# Patient Record
Sex: Female | Born: 1965 | Race: White | Hispanic: No | Smoking: Never smoker
Health system: Southern US, Community
[De-identification: ages and names within clinical notes are randomized; demographics above are authoritative.]

## PROBLEM LIST (undated history)

## (undated) DIAGNOSIS — Z8619 Personal history of other infectious and parasitic diseases: Secondary | ICD-10-CM

## (undated) DIAGNOSIS — G8929 Other chronic pain: Secondary | ICD-10-CM

## (undated) DIAGNOSIS — A770 Spotted fever due to Rickettsia rickettsii: Secondary | ICD-10-CM

## (undated) DIAGNOSIS — M549 Dorsalgia, unspecified: Secondary | ICD-10-CM

## (undated) DIAGNOSIS — F329 Major depressive disorder, single episode, unspecified: Secondary | ICD-10-CM

## (undated) DIAGNOSIS — M797 Fibromyalgia: Secondary | ICD-10-CM

## (undated) DIAGNOSIS — F419 Anxiety disorder, unspecified: Secondary | ICD-10-CM

## (undated) DIAGNOSIS — R413 Other amnesia: Secondary | ICD-10-CM

## (undated) DIAGNOSIS — M792 Neuralgia and neuritis, unspecified: Secondary | ICD-10-CM

## (undated) DIAGNOSIS — F32A Depression, unspecified: Secondary | ICD-10-CM

## (undated) DIAGNOSIS — I10 Essential (primary) hypertension: Secondary | ICD-10-CM

## (undated) DIAGNOSIS — R410 Disorientation, unspecified: Secondary | ICD-10-CM

## (undated) HISTORY — PX: ANKLE SURGERY: SHX546

## (undated) HISTORY — PX: TONSILLECTOMY: SUR1361

## (undated) HISTORY — PX: ABDOMINAL HYSTERECTOMY: SHX81

## (undated) HISTORY — PX: OTHER SURGICAL HISTORY: SHX169

## (undated) HISTORY — PX: TUBAL LIGATION: SHX77

---

## 1998-05-29 ENCOUNTER — Emergency Department (HOSPITAL_COMMUNITY): Admission: EM | Admit: 1998-05-29 | Discharge: 1998-05-29 | Payer: Self-pay | Admitting: *Deleted

## 1998-12-30 ENCOUNTER — Emergency Department (HOSPITAL_COMMUNITY): Admission: EM | Admit: 1998-12-30 | Discharge: 1998-12-30 | Payer: Self-pay | Admitting: Emergency Medicine

## 1999-08-01 ENCOUNTER — Encounter (INDEPENDENT_AMBULATORY_CARE_PROVIDER_SITE_OTHER): Payer: Self-pay | Admitting: Specialist

## 1999-08-01 ENCOUNTER — Inpatient Hospital Stay (HOSPITAL_COMMUNITY): Admission: RE | Admit: 1999-08-01 | Discharge: 1999-08-03 | Payer: Self-pay | Admitting: Obstetrics and Gynecology

## 2000-01-01 ENCOUNTER — Encounter: Payer: Self-pay | Admitting: Orthopedic Surgery

## 2000-01-01 ENCOUNTER — Encounter: Admission: RE | Admit: 2000-01-01 | Discharge: 2000-01-01 | Payer: Self-pay | Admitting: Orthopedic Surgery

## 2000-01-03 ENCOUNTER — Encounter: Admission: RE | Admit: 2000-01-03 | Discharge: 2000-01-03 | Payer: Self-pay | Admitting: Orthopedic Surgery

## 2000-01-03 ENCOUNTER — Encounter: Payer: Self-pay | Admitting: Orthopedic Surgery

## 2000-01-27 ENCOUNTER — Inpatient Hospital Stay (HOSPITAL_COMMUNITY): Admission: EM | Admit: 2000-01-27 | Discharge: 2000-01-28 | Payer: Self-pay | Admitting: Emergency Medicine

## 2000-01-27 ENCOUNTER — Encounter: Payer: Self-pay | Admitting: Orthopaedic Surgery

## 2000-01-27 ENCOUNTER — Encounter: Payer: Self-pay | Admitting: Emergency Medicine

## 2000-12-12 ENCOUNTER — Encounter: Admission: RE | Admit: 2000-12-12 | Discharge: 2000-12-12 | Payer: Self-pay | Admitting: Orthopedic Surgery

## 2000-12-12 ENCOUNTER — Encounter: Payer: Self-pay | Admitting: Orthopedic Surgery

## 2000-12-23 ENCOUNTER — Other Ambulatory Visit: Admission: RE | Admit: 2000-12-23 | Discharge: 2000-12-23 | Payer: Self-pay | Admitting: Obstetrics and Gynecology

## 2001-01-01 ENCOUNTER — Emergency Department (HOSPITAL_COMMUNITY): Admission: EM | Admit: 2001-01-01 | Discharge: 2001-01-01 | Payer: Self-pay | Admitting: Emergency Medicine

## 2001-01-01 ENCOUNTER — Encounter: Payer: Self-pay | Admitting: Emergency Medicine

## 2002-02-08 ENCOUNTER — Inpatient Hospital Stay (HOSPITAL_COMMUNITY): Admission: AD | Admit: 2002-02-08 | Discharge: 2002-02-14 | Payer: Self-pay | Admitting: Psychiatry

## 2003-08-08 ENCOUNTER — Ambulatory Visit (HOSPITAL_BASED_OUTPATIENT_CLINIC_OR_DEPARTMENT_OTHER): Admission: RE | Admit: 2003-08-08 | Discharge: 2003-08-08 | Payer: Self-pay | Admitting: Pain Medicine

## 2003-08-12 ENCOUNTER — Other Ambulatory Visit: Admission: RE | Admit: 2003-08-12 | Discharge: 2003-08-12 | Payer: Self-pay | Admitting: Obstetrics and Gynecology

## 2004-06-06 ENCOUNTER — Ambulatory Visit: Payer: Self-pay | Admitting: Pain Medicine

## 2004-06-12 ENCOUNTER — Ambulatory Visit: Payer: Self-pay | Admitting: Pain Medicine

## 2004-06-20 ENCOUNTER — Ambulatory Visit: Payer: Self-pay | Admitting: Pain Medicine

## 2004-07-09 ENCOUNTER — Inpatient Hospital Stay: Payer: Self-pay | Admitting: Pain Medicine

## 2004-07-09 ENCOUNTER — Ambulatory Visit: Payer: Self-pay | Admitting: Pain Medicine

## 2004-08-23 ENCOUNTER — Ambulatory Visit: Payer: Self-pay | Admitting: Physician Assistant

## 2004-09-11 ENCOUNTER — Ambulatory Visit: Payer: Self-pay | Admitting: Pain Medicine

## 2004-11-15 ENCOUNTER — Ambulatory Visit: Payer: Self-pay | Admitting: Gastroenterology

## 2004-11-16 ENCOUNTER — Ambulatory Visit: Payer: Self-pay | Admitting: Physician Assistant

## 2004-11-20 ENCOUNTER — Ambulatory Visit: Payer: Self-pay | Admitting: Pain Medicine

## 2004-11-27 ENCOUNTER — Ambulatory Visit: Payer: Self-pay | Admitting: Gastroenterology

## 2004-11-28 ENCOUNTER — Ambulatory Visit: Payer: Self-pay | Admitting: Gastroenterology

## 2004-12-11 ENCOUNTER — Ambulatory Visit: Payer: Self-pay | Admitting: Pain Medicine

## 2005-01-02 ENCOUNTER — Ambulatory Visit: Payer: Self-pay | Admitting: Physician Assistant

## 2005-02-05 ENCOUNTER — Ambulatory Visit: Payer: Self-pay | Admitting: Physician Assistant

## 2005-02-12 ENCOUNTER — Ambulatory Visit: Payer: Self-pay | Admitting: Pain Medicine

## 2005-03-11 ENCOUNTER — Ambulatory Visit: Payer: Self-pay | Admitting: Pain Medicine

## 2005-04-09 ENCOUNTER — Ambulatory Visit: Payer: Self-pay | Admitting: Physician Assistant

## 2005-05-13 ENCOUNTER — Ambulatory Visit: Payer: Self-pay | Admitting: Physician Assistant

## 2005-06-06 ENCOUNTER — Ambulatory Visit: Payer: Self-pay | Admitting: Pain Medicine

## 2005-06-18 ENCOUNTER — Ambulatory Visit: Payer: Self-pay | Admitting: Physician Assistant

## 2005-06-25 ENCOUNTER — Ambulatory Visit: Payer: Self-pay | Admitting: Pain Medicine

## 2005-07-17 ENCOUNTER — Ambulatory Visit: Payer: Self-pay | Admitting: Physician Assistant

## 2005-07-30 ENCOUNTER — Ambulatory Visit: Payer: Self-pay | Admitting: Pain Medicine

## 2005-08-06 ENCOUNTER — Ambulatory Visit: Payer: Self-pay | Admitting: Pulmonary Disease

## 2005-08-15 ENCOUNTER — Ambulatory Visit: Payer: Self-pay | Admitting: Physician Assistant

## 2005-09-10 ENCOUNTER — Ambulatory Visit: Payer: Self-pay | Admitting: Pulmonary Disease

## 2005-09-11 ENCOUNTER — Ambulatory Visit: Payer: Self-pay | Admitting: Physician Assistant

## 2005-10-02 ENCOUNTER — Ambulatory Visit: Payer: Self-pay | Admitting: Gastroenterology

## 2005-10-04 ENCOUNTER — Ambulatory Visit: Payer: Self-pay | Admitting: Gastroenterology

## 2005-10-15 ENCOUNTER — Ambulatory Visit: Payer: Self-pay | Admitting: Physician Assistant

## 2005-10-22 ENCOUNTER — Ambulatory Visit: Payer: Self-pay | Admitting: Physician Assistant

## 2005-10-24 ENCOUNTER — Ambulatory Visit: Payer: Self-pay | Admitting: Pain Medicine

## 2005-10-29 ENCOUNTER — Ambulatory Visit: Payer: Self-pay | Admitting: Gastroenterology

## 2005-11-08 ENCOUNTER — Ambulatory Visit: Payer: Self-pay | Admitting: Physician Assistant

## 2005-12-11 ENCOUNTER — Ambulatory Visit: Payer: Self-pay | Admitting: Physician Assistant

## 2005-12-12 ENCOUNTER — Ambulatory Visit: Payer: Self-pay | Admitting: Pain Medicine

## 2005-12-16 ENCOUNTER — Ambulatory Visit: Payer: Self-pay | Admitting: Pain Medicine

## 2006-01-09 ENCOUNTER — Ambulatory Visit: Payer: Self-pay | Admitting: Physician Assistant

## 2006-02-07 ENCOUNTER — Other Ambulatory Visit: Admission: RE | Admit: 2006-02-07 | Discharge: 2006-02-07 | Payer: Self-pay | Admitting: Obstetrics and Gynecology

## 2006-02-11 ENCOUNTER — Ambulatory Visit: Payer: Self-pay | Admitting: Physician Assistant

## 2006-03-11 ENCOUNTER — Ambulatory Visit: Payer: Self-pay | Admitting: Physician Assistant

## 2006-03-31 ENCOUNTER — Ambulatory Visit: Payer: Self-pay | Admitting: Physician Assistant

## 2006-04-03 ENCOUNTER — Ambulatory Visit: Payer: Self-pay | Admitting: Pain Medicine

## 2006-04-16 ENCOUNTER — Ambulatory Visit: Payer: Self-pay | Admitting: Physician Assistant

## 2006-05-15 ENCOUNTER — Ambulatory Visit: Payer: Self-pay | Admitting: Physician Assistant

## 2006-05-19 ENCOUNTER — Ambulatory Visit: Payer: Self-pay | Admitting: Pain Medicine

## 2006-05-26 ENCOUNTER — Emergency Department (HOSPITAL_COMMUNITY): Admission: EM | Admit: 2006-05-26 | Discharge: 2006-05-26 | Payer: Self-pay | Admitting: Emergency Medicine

## 2006-05-30 ENCOUNTER — Ambulatory Visit (HOSPITAL_COMMUNITY): Admission: RE | Admit: 2006-05-30 | Discharge: 2006-05-30 | Payer: Self-pay | Admitting: Neurosurgery

## 2006-06-03 ENCOUNTER — Ambulatory Visit: Payer: Self-pay | Admitting: Pain Medicine

## 2006-06-12 ENCOUNTER — Ambulatory Visit: Payer: Self-pay | Admitting: Physician Assistant

## 2006-06-19 ENCOUNTER — Ambulatory Visit: Payer: Self-pay | Admitting: Pain Medicine

## 2006-07-10 ENCOUNTER — Ambulatory Visit: Payer: Self-pay | Admitting: Physician Assistant

## 2006-08-07 ENCOUNTER — Ambulatory Visit: Payer: Self-pay | Admitting: Physician Assistant

## 2006-08-14 ENCOUNTER — Ambulatory Visit: Payer: Self-pay | Admitting: Pain Medicine

## 2006-09-10 ENCOUNTER — Ambulatory Visit: Payer: Self-pay | Admitting: Physician Assistant

## 2006-10-14 ENCOUNTER — Ambulatory Visit: Payer: Self-pay | Admitting: Pain Medicine

## 2006-11-12 ENCOUNTER — Ambulatory Visit: Payer: Self-pay | Admitting: Physician Assistant

## 2006-11-28 ENCOUNTER — Ambulatory Visit: Payer: Self-pay | Admitting: Physician Assistant

## 2006-12-08 ENCOUNTER — Ambulatory Visit: Payer: Self-pay | Admitting: Physician Assistant

## 2006-12-22 ENCOUNTER — Ambulatory Visit: Payer: Self-pay | Admitting: Physician Assistant

## 2007-01-21 ENCOUNTER — Ambulatory Visit: Payer: Self-pay | Admitting: Physician Assistant

## 2007-01-29 ENCOUNTER — Ambulatory Visit: Payer: Self-pay | Admitting: Pain Medicine

## 2007-02-17 ENCOUNTER — Ambulatory Visit: Payer: Self-pay | Admitting: Physician Assistant

## 2007-03-17 ENCOUNTER — Ambulatory Visit: Payer: Self-pay | Admitting: Pain Medicine

## 2007-06-22 ENCOUNTER — Ambulatory Visit: Payer: Self-pay | Admitting: Physician Assistant

## 2007-07-21 ENCOUNTER — Ambulatory Visit: Payer: Self-pay | Admitting: Physician Assistant

## 2007-08-11 ENCOUNTER — Ambulatory Visit: Payer: Self-pay | Admitting: Pain Medicine

## 2007-08-14 ENCOUNTER — Ambulatory Visit: Payer: Self-pay | Admitting: Physician Assistant

## 2007-12-31 ENCOUNTER — Ambulatory Visit: Payer: Self-pay | Admitting: Physician Assistant

## 2008-01-21 ENCOUNTER — Ambulatory Visit: Payer: Self-pay | Admitting: Pain Medicine

## 2008-09-21 ENCOUNTER — Ambulatory Visit: Payer: Self-pay | Admitting: Pain Medicine

## 2008-09-22 ENCOUNTER — Ambulatory Visit: Payer: Self-pay | Admitting: Pain Medicine

## 2009-10-08 ENCOUNTER — Emergency Department (HOSPITAL_COMMUNITY): Admission: EM | Admit: 2009-10-08 | Discharge: 2009-10-08 | Payer: Self-pay | Admitting: Emergency Medicine

## 2010-02-18 ENCOUNTER — Emergency Department (HOSPITAL_COMMUNITY): Admission: EM | Admit: 2010-02-18 | Discharge: 2010-02-18 | Payer: Self-pay | Admitting: Emergency Medicine

## 2010-05-27 ENCOUNTER — Emergency Department (HOSPITAL_COMMUNITY): Admission: EM | Admit: 2010-05-27 | Discharge: 2010-05-27 | Payer: Self-pay | Admitting: Emergency Medicine

## 2010-09-15 ENCOUNTER — Encounter: Payer: Self-pay | Admitting: Neurosurgery

## 2010-09-16 ENCOUNTER — Encounter: Payer: Self-pay | Admitting: Obstetrics and Gynecology

## 2010-12-28 ENCOUNTER — Emergency Department (HOSPITAL_COMMUNITY)
Admission: EM | Admit: 2010-12-28 | Discharge: 2010-12-28 | Disposition: A | Discharge status: Home / Self Care | Payer: Medicare Other | Attending: Emergency Medicine | Admitting: Emergency Medicine

## 2010-12-28 ENCOUNTER — Emergency Department (HOSPITAL_COMMUNITY): Payer: Medicare Other

## 2010-12-28 DIAGNOSIS — R05 Cough: Secondary | ICD-10-CM | POA: Insufficient documentation

## 2010-12-28 DIAGNOSIS — R0602 Shortness of breath: Secondary | ICD-10-CM | POA: Insufficient documentation

## 2010-12-28 DIAGNOSIS — J40 Bronchitis, not specified as acute or chronic: Secondary | ICD-10-CM | POA: Insufficient documentation

## 2010-12-28 DIAGNOSIS — R509 Fever, unspecified: Secondary | ICD-10-CM | POA: Insufficient documentation

## 2010-12-28 DIAGNOSIS — R059 Cough, unspecified: Secondary | ICD-10-CM | POA: Insufficient documentation

## 2010-12-28 DIAGNOSIS — R5381 Other malaise: Secondary | ICD-10-CM | POA: Insufficient documentation

## 2010-12-28 DIAGNOSIS — IMO0001 Reserved for inherently not codable concepts without codable children: Secondary | ICD-10-CM | POA: Insufficient documentation

## 2010-12-28 DIAGNOSIS — W03XXXA Other fall on same level due to collision with another person, initial encounter: Secondary | ICD-10-CM | POA: Insufficient documentation

## 2010-12-28 DIAGNOSIS — R51 Headache: Secondary | ICD-10-CM | POA: Insufficient documentation

## 2010-12-28 DIAGNOSIS — IMO0002 Reserved for concepts with insufficient information to code with codable children: Secondary | ICD-10-CM | POA: Insufficient documentation

## 2010-12-28 DIAGNOSIS — R5383 Other fatigue: Secondary | ICD-10-CM | POA: Insufficient documentation

## 2010-12-31 ENCOUNTER — Emergency Department (HOSPITAL_COMMUNITY): Admission: EM | Admit: 2010-12-31 | Payer: Medicare Other | Source: Home / Self Care

## 2011-01-06 ENCOUNTER — Emergency Department (HOSPITAL_COMMUNITY): Payer: Medicare Other

## 2011-01-06 ENCOUNTER — Emergency Department (HOSPITAL_COMMUNITY)
Admission: EM | Admit: 2011-01-06 | Discharge: 2011-01-06 | Disposition: A | Discharge status: Home / Self Care | Payer: Medicare Other | Attending: Emergency Medicine | Admitting: Emergency Medicine

## 2011-01-06 DIAGNOSIS — J069 Acute upper respiratory infection, unspecified: Secondary | ICD-10-CM | POA: Insufficient documentation

## 2011-01-06 DIAGNOSIS — R509 Fever, unspecified: Secondary | ICD-10-CM | POA: Insufficient documentation

## 2011-01-06 DIAGNOSIS — R5381 Other malaise: Secondary | ICD-10-CM | POA: Insufficient documentation

## 2011-01-06 DIAGNOSIS — R059 Cough, unspecified: Secondary | ICD-10-CM | POA: Insufficient documentation

## 2011-01-06 DIAGNOSIS — R05 Cough: Secondary | ICD-10-CM | POA: Insufficient documentation

## 2011-01-06 DIAGNOSIS — IMO0001 Reserved for inherently not codable concepts without codable children: Secondary | ICD-10-CM | POA: Insufficient documentation

## 2011-01-06 DIAGNOSIS — F341 Dysthymic disorder: Secondary | ICD-10-CM | POA: Insufficient documentation

## 2011-01-06 DIAGNOSIS — J3489 Other specified disorders of nose and nasal sinuses: Secondary | ICD-10-CM | POA: Insufficient documentation

## 2011-01-06 LAB — BASIC METABOLIC PANEL
CO2: 20 mEq/L (ref 19–32)
Chloride: 98 mEq/L (ref 96–112)
GFR calc Af Amer: >60 mL/min (ref >60)
Potassium: 4.2 mEq/L (ref 3.5–5.1)
Sodium: 132 mEq/L — ABNORMAL LOW (ref 135–145)

## 2011-01-06 LAB — CBC
HCT: 36.5 % (ref 36.0–46.0)
Hemoglobin: 12.7 g/dL (ref 12.0–15.0)
MCHC: 34.8 g/dL (ref 30.0–36.0)
RBC: 3.93 MIL/uL (ref 3.87–5.11)
WBC: 9.1 10*3/uL (ref 4.0–10.5)

## 2011-01-06 LAB — DIFFERENTIAL
Basophils Absolute: 0 10*3/uL (ref 0.0–0.1)
Lymphocytes Relative: 28 % (ref 12–46)
Monocytes Absolute: 0.6 10*3/uL (ref 0.1–1.0)
Neutro Abs: 6 10*3/uL (ref 1.7–7.7)
Neutrophils Relative %: 66 % (ref 43–77)

## 2011-01-06 LAB — URINALYSIS, ROUTINE W REFLEX MICROSCOPIC
Glucose, UA: NEGATIVE mg/dL
Hgb urine dipstick: NEGATIVE
Specific Gravity, Urine: 1.034 — ABNORMAL HIGH (ref 1.005–1.030)
pH: 5 (ref 5.0–8.0)

## 2011-01-11 NOTE — H&P (Signed)
[facility]  Patient:    Kimberly [name], Kimberly [name] Visit Number: [id number] MRN: [id number]          Service Type: PSY Location: 500 0501 01 Attending Physician:  Jeanice Lim Dictated by:   Candi Leash. Orsini, N.P. Admit Date:  02/08/2002                     Psychiatric Admission Assessment  IDENTIFYING INFORMATION:  This is a 45 year old single white female voluntarily admitted for depression and suicidal thoughts on February 08, 2002.  HISTORY OF PRESENT ILLNESS:  The patient presents with a history of depression, suicidal thoughts with no specific plan.  The patient is feeling very overwhelmed and stressed over her comp case from work.  She feels very hopeless and helpless.  She has been on OxyContin and hydrocodone for fibromyalgia for the past year.  Reports no increase in use and does not desire to be detoxed.  The patient did state that she did try to detox herself one year ago but her intention now is not to get off her medication.  She reports decreased sleep, decreased appetite, denies any psychotic symptoms. The patient states she is too miserable to attend groups.  She has signed her 72-hour release.  PAST PSYCHIATRIC HISTORY:  First hospitalization to [facility]. Was at [facility] in the year of 2000 for a suicide attempt, where patient overdosed.  She has a history of trying to detox herself from her pain pills on her own in the spring of 2000.  SOCIAL HISTORY:  This is a 45 year old single white female.  She has two children, 15 and 20.  She lives with her 47 year old.  She is disabled from her medical problem, fibromyalgia.  She has a DWI pending in July.  FAMILY HISTORY:  Mother with depression.  Cousin with bipolar.  ALCOHOL/DRUG HISTORY:  Nonsmoker.  Denies any alcohol or substance abuse.  PRIMARY CARE [place]:  Dr. Tiburcio Pea in [place] and she sees a rheumatologist, Dr. Janeece Agee, in [place].  MEDICAL PROBLEMS:   Fibromyalgia.  MEDICATIONS:  OxyContin, hydrocodone prescribed by Dr. Tiburcio Pea.  She has been off her medications since Monday morning.  Has recently been put on Paxil CR 12.5 mg.  DRUG ALLERGIES:  ZOLOFT (unsure of the reaction).  PHYSICAL EXAMINATION:  GENERAL APPEARANCE:  The patient is a 45 year old Caucasian female in no acute distress.  Well-developed, appears her stated age.  Somewhat unkempt, alert and cooperative.  VITAL SIGNS:  She is 6 feet 4 inches.  She is 145 pounds.  Temperature 98.9, heart rate 104, respirations 20, blood pressure 133/100.  HEENT:  Head is normocephalic.  Hair is clean, brown, almost shoulder-length, evenly distributed.  External ear canals are patent.  Hearing is appropriate to conversation.  No sinus tenderness.  No nasal discharge.  NECK:  Supple.  No JVD.  Negative lymphadenopathy.  Thyroid is nonpalpable, nontender.  CHEST:  Clear to auscultation.  HEART:  Regular rate and rhythm without murmurs, gallops or rubs.  BREASTS:  Exam is deferred.  MUSCULOSKELETAL:  No joint swelling or deformity.  Good range of motion. Muscle strength and tone is equal bilaterally.  SKIN:  Warm and dry with good turgor.  Good strong bilateral radial pulses. Nail beds are pink with good capillary refill.  No rashes or lacerations.  NEURO:  Cranial nerves are grossly intact.  Good grip strength bilaterally. No involuntary movements.  Cerebellar function intact with heel to shin and normal alternating movements.  LABORATORY DATA:  CBC within normal limits.  CMET within normal limits. Urinalysis was negative.  MENTAL STATUS EXAMINATION:  She is an alert, young adult, casually dressed female.  Cooperative.  Fair eye contact.  Speech is clear.  Affect is depressed.  Thought processes are coherent.  There is no evidence of psychosis.  No auditory or visual hallucinations.  No suicidal or homicidal ideation.  Cognitive function intact.  Memory is fair.  Judgment  is fair. Insight is fair.  DIAGNOSES: Axis I:    Major depression, recurrent. Axis II:   Deferred. Axis III:  Fibromyalgia. Axis IV:   Problems with occupation, legal system, other psychosocial            problems, medical problems. Axis V:    Current 30; estimated this past year 61.  PLAN:  Voluntary admission for depression and opiate dependence.  Will contract for safety.  Check every 15 minutes.  Will initiate the Wytensin protocol.  The patient was agreeable to be detoxed.  Paxil was continued for her depression.  The patient to attend groups, to follow up with mental health, to be medication-compliant.  TENTATIVE LENGTH OF STAY:  Three to five days. Dictated by:   Candi Leash. Orsini, N.P. Attending Physician:  Jeanice Lim DD:  02/09/02 TD:  02/09/02 Job: [id number]

## 2011-01-11 NOTE — Op Note (Signed)
[place]  Patient:    Kimberly [name], Kimberly [name] [facility]                 MRN: [id number] Proc. Date: 01/27/00 Adm. Date:  [id number] Disc. Date: [id number] Attending:  Randolm Idol Dictator:   Claude Manges. Cleophas Dunker, M.D.                           Operative Report  PREOPERATIVE DIAGNOSIS:  Displaced bimalleolar fracture, left ankle.  POSTOPERATIVE DIAGNOSIS:  Displaced bimalleolar fracture, left ankle.  PROCEDURE:  Open reduction internal fixation.  SURGEON:  Dr. Cleophas Dunker.  ASSISTANT:  Arnoldo Morale, P.A.  ANESTHESIA:  General orotracheal.  COMPLICATIONS:  None.  DESCRIPTION OF PROCEDURE:  With the patient comfortable on the operating room table and under general orotracheal anesthesia, a thigh tourniquet was applied to the left thigh.  The left leg was then elevated and prepped with Duraprep from the tips of the toes to above the knee.  Sterile draping was performed. With the extremity still elevated it was Esmarched, exsanguinated with a proximal tourniquet at 350 mmHg.  Initial procedure was performed laterally.  A longitudinal incision was made beginning at the tip of the lateral malleolus extending proximally.  Sharp dissection, about a 3 to 4 inch incision was made, and then by blunt dissection the soft tissue was elevated off the fibula periosteum. Subperiosteal dissection was performed.  The fracture was identified just proximal to the ankle mortis.  It was slightly oblique, and the distal fragment was displaced laterally.  Under direct visualization the fracture was reduced and maintained with a bone clamp.  A 7 hole small fragment ace titanium plate was affixed to the fibula and bent to conform to its shape.  Each of the holes was subsequently drilled, measured, and filled with self-taping screws.  The distal three holes with filled with cancellous screws, and the proximal four holes with the cortical screws.  Image santification  revealed very distal screw was not in bone.  Accordingly, it was re-drilled, and redirected obliquely and more proximally.  At that point it had excellent fixation, the fracture was reduced anatomically, and the screws were well within the bone, did not cross the ankle mortis.  The wound was irrigated with saline solution.  An oblique incision was then made over the medial malleolus, and via blunt dissection carried down to the subcutaneous tissue.  Soft tissue was elevated off the fracture which was displaced probably 8 to 9 mm.  Under direct visualization the fracture was reduced.  There was periosteal invagination of the fracture site preventing anatomic reduction.  This was elevated off the bone and then reduced and maintained with the bone clamp and a K-wire.  A drill hole was then made and filled with a 42 mm cancellous screw with a washer.  It had excellent fixation.  Image sensification of both AP and lateral projections revealed excellent position of the ankle mortis and fracture fragments.  The wounds were irrigated with saline solution.  The periosteum closed with 3-0 Vicryl, subcutaneous with 3-0 Vicryl, skin closed with skin clips. Marcaine 0.25% without epinephrine was injected in the wound edges.  A sterile bulky dressing was applied, followed by posterior splints.  The tourniquet was deflated with immediate capillary refill to the toes.  The patient tolerated the procedure well without complications. DD:  01/27/00 TD:  01/30/00 Job: [id number]

## 2011-01-11 NOTE — Discharge Summary (Signed)
Kimberly [name], Kimberly [name]                      ACCOUNT NO.:  [id number]   MEDICAL RECORD NO.:  [id number]                   PATIENT TYPE:  PS   LOCATION:  0501                                 FACILITY:  [facility]   PHYSICIAN:  Reymundo Poll. Dub Mikes, M.D.                DATE OF BIRTH:  1966/06/26   DATE OF ADMISSION:  02/08/2002  DATE OF DISCHARGE:  02/14/2002                                 DISCHARGE SUMMARY   CHIEF COMPLAINT AND PRESENT ILLNESS:  This was the first admission to [facility] for this 45 year old single white female admitted for  depression and suicidal thoughts.  History of feeling very overwhelmed,  stressed over her workman's comp case from work.  Feels very hopeless,  helpless.  Has been on OxyContin and hydrocodone for fibromyalgia for the  past year.  No increasing use and does not desire to be detoxed.  Did state  that she did try to detox herself one year ago but her intention is not to  get off her medication.  Decreased sleep, decreased appetite, no psychotic  symptoms.   PAST PSYCHIATRIC HISTORY:  First time at [facility].  She was  in [facility] in 2000.   ALCOHOL/DRUG HISTORY:  Denies the use or abuse of any substances.   PAST MEDICAL HISTORY:  Fibromyalgia.   MEDICATIONS:  OxyContin, hydrocodone.  Off medications since Monday.  Recently been put on Paxil CR 12.5 mg.   PHYSICAL EXAMINATION:  Performed and failed to show any acute findings.   MENTAL STATUS EXAM:  Alert, young female casually dressed, cooperative.  Fair eye contact.  Speech is clear.  Affect is depressed.  Thought processes  are coherent.  No evidence of psychosis.  No auditory or visual  hallucinations.  No suicidal or homicidal ideation.  Cognition well-  preserved.   ADMISSION DIAGNOSES:   AXIS I:  Major depression, recurrent.   AXIS II:  No diagnosis.   AXIS III:  Fibromyalgia.   AXIS IV:  Moderate.   AXIS V:  Global Assessment of  Functioning on admission 30; highest Global  Assessment of Functioning in the last year 70.   LABORATORY DATA:  CBC was within normal limits.  Blood chemistries were  within normal limits.  Thyroid profile was within normal limits.  Drug  screen was positive for benzodiazepines and opiates that she had been  prescribed.   HOSPITAL COURSE:  She was admitted and started intensive individual and  group psychotherapy.  Worked on helping her with her mood.  She was able to,  in individual and group psychotherapy, work on dealing with the losses.  She  did recognized that she was dependent on the benzodiazepines and that she  was wanting to give it a try to come off.  She also minimized the use of  alcohol.  She was recently charged with a DWI.  She was detoxified using  clonidine and she dealt with issues of grief, loss, admitting that she just  wanted to numb herself.  Issues of the death of the mother.  She was given  trazodone for sleep that was beneficial.  As the hospitalization progressed,  her mood improved.  Her affect became brighter.  She was more cognizant and  aimed with the plan to clean up, so they can reassess her treatment for her  pain.  Upon discharge, fully detoxed, in full contact with reality.  Mood  improved.  Affect brighter.  No suicidal or homicidal ideation.   DISCHARGE DIAGNOSES:   AXIS I:  1. Major depression, recurrent.  2. Alcohol abuse.   AXIS II:  No diagnosis.   AXIS III:  Fibromyalgia.   AXIS IV:  Moderate.   AXIS V:  Global Assessment of Functioning upon discharge 60.   DISCHARGE MEDICATIONS:  1. Paxil CR 12.5 mg.  2. Ambien 10 mg daily.   FOLLOW UP:  Dr. Earline Mayotte.                                                Madie Reno A. Dub Mikes, M.D.    IAL/MEDQ  D:  03/31/2002  T:  04/03/2002  Job:  [id number]

## 2011-01-11 NOTE — H&P (Signed)
[place]  Patient:    Kimberly [name], Kimberly [name] [facility]                 MRN: [id number] Adm. Date:  [id number] Attending:  Randolm Idol                         History and Physical  CHIEF COMPLAINT:  Left ankle pain.  HISTORY:  This 45 year old female twisted her left ankle, "coming off the last step of my deck" this early evening.  She experienced immediate onset of pain and swelling; however, she was able to bear weight "with pain."  She was brought to the emergency room by her friend, with films noting a displaced bimalleolar fracture.  She will now require open reduction and internal fixation.  ALLERGIES:  ZOLOFT, causing chest pain.  PRESENT MEDICATIONS: 1. Elavil and she did not know the dose. 2. Prozac 20 mg a day. 3. Hydrocodone p.r.n. for pain. 4. Neurontin 600 mg three times a day. 5. Diazepam.  PAST MEDICAL HISTORY:  She has a history of an on-the-job injury to her right knee in 1997 with subsequent surgery in 1998 at [facility] and has developed reflex sympathetic dystrophy.  She is thus being treated through the pain clinic at [facility] with the above medicine.  She has also had a history of previous left knee surgery performed by Dr. Lenard Galloway. 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  She is having trouble with both of her knees but is much more symptomatic on the right than she is on the left.  There is a history of fibromyalgia.  PREVIOUS SURGERY:  Include that as noted above.  She has had a previous history of tubal ligation and hysterectomy in December of 2000.  PHYSICAL EXAMINATION:  VITAL SIGNS:  Temperature is 97.2.  Pulse 88.  Respiratory rate was 18.  Blood pressure was 132/75.  GENERAL:  She is a well-developed, well-nourished 45 year old white female without much pain.  She was awake, alert and oriented x 3.  She had one beer probably four to five hours prior to admission.  NECK:   She had full range of motion of the cervical spine without pain, although she notes that on occasion, she will have pain as a result of her fibromyalgia.  CHEST:  Clear.  BREASTS:  Deferred.  CARDIAC:  There were no murmurs, rubs, or gallops in the precordium.  ABDOMEN:  Soft and nontender with active bowel sounds.  EXTREMITIES:  Her left ankle revealed moderate edema with early ecchymosis, both medially and laterally.  Skin was intact.  Neurovascular exam appeared to be intact.  She had gross crepitation with inversion and eversion of her ankle and there was pain directly over the lateral and medial malleoli.  I did examine both of her knees; there was no effusion.  She had mild medial and lateral joint pain, more so on the right knee than on the left.  I could not elicit instability and even under anesthesia in the operating room, I felt that she had about a 2-mm anterior drawer sign, both on the left as well as the right.  IMPRESSION:  Displaced bimalleolar fracture, left ankle.  PLAN:  Open reduction and internal fixation. DD:  01/27/00 TD:  01/28/00 Job: [id number]

## 2011-03-18 ENCOUNTER — Emergency Department (HOSPITAL_COMMUNITY): Payer: Medicare Other

## 2011-03-18 ENCOUNTER — Encounter (HOSPITAL_COMMUNITY): Payer: Self-pay | Admitting: Radiology

## 2011-03-18 ENCOUNTER — Emergency Department (HOSPITAL_COMMUNITY)
Admission: EM | Admit: 2011-03-18 | Discharge: 2011-03-18 | Disposition: A | Discharge status: Home / Self Care | Payer: Medicare Other | Attending: Emergency Medicine | Admitting: Emergency Medicine

## 2011-03-18 DIAGNOSIS — H538 Other visual disturbances: Secondary | ICD-10-CM | POA: Insufficient documentation

## 2011-03-18 DIAGNOSIS — R220 Localized swelling, mass and lump, head: Secondary | ICD-10-CM | POA: Insufficient documentation

## 2011-03-18 DIAGNOSIS — R51 Headache: Secondary | ICD-10-CM | POA: Insufficient documentation

## 2011-03-18 HISTORY — DX: Other chronic pain: G89.29

## 2011-03-18 HISTORY — DX: Depression, unspecified: F32.A

## 2011-03-18 HISTORY — DX: Anxiety disorder, unspecified: F41.9

## 2011-03-18 HISTORY — DX: Fibromyalgia: M79.7

## 2011-03-18 HISTORY — DX: Dorsalgia, unspecified: M54.9

## 2011-03-18 HISTORY — DX: Major depressive disorder, single episode, unspecified: F32.9

## 2011-07-05 ENCOUNTER — Emergency Department (HOSPITAL_COMMUNITY): Payer: Medicare Other

## 2011-07-05 ENCOUNTER — Encounter (HOSPITAL_COMMUNITY): Payer: Self-pay | Admitting: Emergency Medicine

## 2011-07-05 ENCOUNTER — Emergency Department (HOSPITAL_COMMUNITY)
Admission: EM | Admit: 2011-07-05 | Discharge: 2011-07-05 | Discharge status: Against Medical Advice | Payer: Medicare Other | Attending: Emergency Medicine | Admitting: Emergency Medicine

## 2011-07-05 DIAGNOSIS — F410 Panic disorder [episodic paroxysmal anxiety] without agoraphobia: Secondary | ICD-10-CM | POA: Insufficient documentation

## 2011-07-05 DIAGNOSIS — M549 Dorsalgia, unspecified: Secondary | ICD-10-CM | POA: Insufficient documentation

## 2011-07-05 DIAGNOSIS — F419 Anxiety disorder, unspecified: Secondary | ICD-10-CM

## 2011-07-05 DIAGNOSIS — Z79899 Other long term (current) drug therapy: Secondary | ICD-10-CM | POA: Insufficient documentation

## 2011-07-05 DIAGNOSIS — F341 Dysthymic disorder: Secondary | ICD-10-CM | POA: Insufficient documentation

## 2011-07-05 DIAGNOSIS — G8929 Other chronic pain: Secondary | ICD-10-CM | POA: Insufficient documentation

## 2011-07-05 DIAGNOSIS — R079 Chest pain, unspecified: Secondary | ICD-10-CM | POA: Insufficient documentation

## 2011-07-05 MED ORDER — LORAZEPAM 1 MG PO TABS: 1.0000 mg | ORAL_TABLET | Freq: Once | ORAL | Status: DC

## 2011-07-05 NOTE — ED Provider Notes (Signed)
History     CSN: [id number] Arrival date & time: 07/05/2011 12:43 AM   First MD Initiated Contact with Patient 07/05/11 0115      Chief Complaint  Patient presents with  . Panic Attack    (Consider location/radiation/quality/duration/timing/severity/associated sxs/prior treatment) Patient is a 45 y.o. female presenting with anxiety.  Anxiety This is a recurrent problem. The current episode started 3 to 5 hours ago. The problem occurs constantly. The problem has not changed since onset.Associated symptoms include chest pain. Pertinent negatives include no abdominal pain, no headaches and no shortness of breath. The symptoms are aggravated by nothing. The symptoms are relieved by nothing. Treatments tried: 2 mg of klonopin and a beer. The treatment provided no relief.  Denies f/c/r.  No DOE, no SOB, no n/v/d.  Has pressure across the chest.    Past Medical History  Diagnosis Date  . Anxiety   . Depression   . Fibromyalgia   . Back pain, chronic     History reviewed. No pertinent past surgical history.  History reviewed. No pertinent family history.  History  Substance Use Topics  . Smoking status: Not on file  . Smokeless tobacco: Not on file  . Alcohol Use: Not on file    OB History    Grav Para Term Preterm Abortions TAB SAB Ect Mult Living                  Review of Systems  Constitutional: Negative for activity change.  HENT: Negative.   Respiratory: Negative for shortness of breath.   Cardiovascular: Positive for chest pain.  Gastrointestinal: Negative for abdominal pain and abdominal distention.  Genitourinary: Negative for difficulty urinating.  Musculoskeletal: Negative.   Neurological: Negative for headaches.  Hematological: Negative.   Psychiatric/Behavioral: Negative for suicidal ideas, hallucinations, confusion, sleep disturbance and decreased concentration.    Allergies  Gabapentin and Zoloft  Home Medications   Current Outpatient Rx  Name  Route Sig Dispense Refill  . DULOXETINE HCL 60 MG PO CPEP Oral Take 60 mg by mouth 2 (two) times daily.      Marland Kitchen HYDROCODONE-ACETAMINOPHEN 10-325 MG PO TABS Oral Take 1 tablet by mouth every 4 (four) hours as needed. For pain     . SYNTHROID PO Oral Take 1 tablet by mouth daily.      Marland Kitchen PRESCRIPTION MEDICATION Oral Take 1 tablet by mouth daily. Pt takes a blood pressure medication, but is unsure of name or strength     . ZOLPIDEM TARTRATE 10 MG PO TABS Oral Take 10 mg by mouth at bedtime.        BP 151/105  Pulse 68  Temp(Src) 97.9 F (36.6 C) (Oral)  Resp 17  SpO2 100%  Physical Exam  Constitutional: She is oriented to person, place, and time. She appears well-developed and well-nourished. No distress.  HENT:  Head: Normocephalic and atraumatic.  Eyes: EOM are normal. Right eye exhibits no discharge. Left eye exhibits no discharge.  Neck: Neck supple.  Cardiovascular: Normal rate and regular rhythm.  Exam reveals no gallop and no friction rub.   No murmur heard. Pulmonary/Chest: Effort normal and breath sounds normal. No respiratory distress.  Abdominal: Soft. Bowel sounds are normal.  Musculoskeletal: Normal range of motion.  Neurological: She is alert and oriented to person, place, and time.  Skin: Skin is warm and dry. She is not diaphoretic.  Psychiatric: Thought content normal.    ED Course  Procedures (including critical care time)   Labs Reviewed  CBC  I-STAT TROPONIN I  I-STAT, CHEM 8   Dg Chest 2 View  07/05/2011  *RADIOLOGY REPORT*  Clinical Data: Anxiety attack  CHEST - 2 VIEW  Comparison: 01/06/2011  Findings: Normal heart size.  Clear lungs.  IMPRESSION: Negative.  Original Report Authenticated By: Donavan Burnet, M.D.     1. Anxiety       MDM  Ordered EKG, troponins chest XRAY.  Patient refusing labs and EKG stating she does not want them.  She wants no intervention excepting ativan.  Risks of leaving AMA are death, heart attack, Pulmonary embolism, CHF,  stroke, prolonged morbidity.  EDP explained the risks of signing AMA and patient signs form, she has decision making capacity to refuse.  Welcomed to return at any time       Kayleah Appleyard Smitty Cords, MD 07/05/11 9629

## 2011-07-05 NOTE — ED Notes (Signed)
Patient with social stressors, loss of job, house and living situation.  Patient is CAOx3, having anxiety due to situation.  Patient is appropriate for situation.  Calm and cooperative, having anxious moments.

## 2011-07-05 NOTE — ED Notes (Signed)
PT. REPORTS PANICK AND ANXIETY ATTACK THIS EVENING - TOOK KLONOPIN WITH NO RELIEF.

## 2011-07-23 ENCOUNTER — Other Ambulatory Visit: Payer: Self-pay | Admitting: Family Medicine

## 2011-07-23 DIAGNOSIS — R51 Headache: Secondary | ICD-10-CM

## 2011-07-26 ENCOUNTER — Other Ambulatory Visit: Payer: Medicare Other

## 2011-08-20 ENCOUNTER — Encounter (HOSPITAL_COMMUNITY): Payer: Self-pay | Admitting: *Deleted

## 2011-08-20 ENCOUNTER — Emergency Department (HOSPITAL_COMMUNITY)
Admission: EM | Admit: 2011-08-20 | Discharge: 2011-08-20 | Disposition: A | Discharge status: Home / Self Care | Payer: Medicare Other | Attending: Emergency Medicine | Admitting: Emergency Medicine

## 2011-08-20 ENCOUNTER — Emergency Department (HOSPITAL_COMMUNITY): Payer: Medicare Other

## 2011-08-20 DIAGNOSIS — S335XXA Sprain of ligaments of lumbar spine, initial encounter: Secondary | ICD-10-CM | POA: Insufficient documentation

## 2011-08-20 DIAGNOSIS — M5380 Other specified dorsopathies, site unspecified: Secondary | ICD-10-CM | POA: Insufficient documentation

## 2011-08-20 DIAGNOSIS — M545 Low back pain, unspecified: Secondary | ICD-10-CM | POA: Insufficient documentation

## 2011-08-20 DIAGNOSIS — S39012A Strain of muscle, fascia and tendon of lower back, initial encounter: Secondary | ICD-10-CM

## 2011-08-20 DIAGNOSIS — Z79899 Other long term (current) drug therapy: Secondary | ICD-10-CM | POA: Insufficient documentation

## 2011-08-20 DIAGNOSIS — W1789XA Other fall from one level to another, initial encounter: Secondary | ICD-10-CM | POA: Insufficient documentation

## 2011-08-20 DIAGNOSIS — F341 Dysthymic disorder: Secondary | ICD-10-CM | POA: Insufficient documentation

## 2011-08-20 DIAGNOSIS — IMO0001 Reserved for inherently not codable concepts without codable children: Secondary | ICD-10-CM | POA: Insufficient documentation

## 2011-08-20 MED ORDER — HYDROCODONE-ACETAMINOPHEN 10-325 MG PO TABS: 1.0000 | ORAL_TABLET | ORAL | Status: DC | PRN

## 2011-08-20 MED ORDER — NAPROXEN 500 MG PO TABS: 500.0000 mg | ORAL_TABLET | Freq: Two times a day (BID) | ORAL | Status: DC

## 2011-08-20 MED ORDER — HYDROMORPHONE HCL PF 2 MG/ML IJ SOLN
2.0000 mg | Freq: Once | INTRAMUSCULAR | Status: AC
  Administered 2011-08-20: 2 mg via INTRAMUSCULAR
  Filled 2011-08-20: qty 1

## 2011-08-20 MED ORDER — METHYLPREDNISOLONE SODIUM SUCC 125 MG IJ SOLR: 125.0000 mg | Freq: Once | INTRAMUSCULAR | Status: DC

## 2011-08-20 MED ORDER — CYCLOBENZAPRINE HCL 5 MG PO TABS: 5.0000 mg | ORAL_TABLET | Freq: Three times a day (TID) | ORAL | Status: AC | PRN

## 2011-08-20 MED ORDER — ALBUTEROL SULFATE (5 MG/ML) 0.5% IN NEBU: 5.0000 mg | INHALATION_SOLUTION | Freq: Once | RESPIRATORY_TRACT | Status: DC

## 2011-08-20 MED ORDER — IPRATROPIUM BROMIDE 0.02 % IN SOLN: 0.5000 mg | Freq: Once | RESPIRATORY_TRACT | Status: DC

## 2011-08-20 NOTE — ED Notes (Signed)
Pt reports falling off the front porch steps this am, having lower back pain.

## 2011-08-20 NOTE — ED Provider Notes (Signed)
History     CSN: [id number]  Arrival date & time 08/20/11  4098   First MD Initiated Contact with Patient 08/20/11 [phone]      Chief Complaint  Patient presents with  . Fall  . Back Pain    (Consider location/radiation/quality/duration/timing/severity/associated sxs/prior treatment) HPI Patient states she had a fall yesterday injuring her back. Patient was walking off the porch and fell landing on her lower back. Patient states she came in this morning because the pain continues to be severe. Patient denies any numbness or weakness. She denies any abdominal pain. She has had trouble with her back in the past and she also has history of fibromyalgia. Patient has not had any trouble with incontinence. The pain is in her lower back. It is midline. It increases with any movement and she describes it as severe. Past Medical History  Diagnosis Date  . Anxiety   . Depression   . Fibromyalgia   . Back pain, chronic     History reviewed. No pertinent past surgical history.  History reviewed. No pertinent family history.  History  Substance Use Topics  . Smoking status: Never Smoker   . Smokeless tobacco: Not on file  . Alcohol Use: Yes     occ    OB History    Grav Para Term Preterm Abortions TAB SAB Ect Mult Living                  Review of Systems  All other systems reviewed and are negative.    Allergies  Gabapentin and Zoloft  Home Medications   Current Outpatient Rx  Name Route Sig Dispense Refill  . DULOXETINE HCL 60 MG PO CPEP Oral Take 60 mg by mouth 2 (two) times daily.      Marland Kitchen HYDROCODONE-ACETAMINOPHEN 10-325 MG PO TABS Oral Take 1 tablet by mouth every 4 (four) hours as needed. For pain     . LEVOTHYROXINE SODIUM 100 MCG PO TABS Oral Take 100 mcg by mouth daily.      Marland Kitchen PRESCRIPTION MEDICATION Oral Take 1 tablet by mouth daily. Benazepril. Patient unsure of dose pharmacy to be called 12/26.     Marland Kitchen ZOLPIDEM TARTRATE 10 MG PO TABS Oral Take 10 mg by mouth at  bedtime.        BP 143/83  Pulse 64  Temp(Src) 97.8 F (36.6 C) (Oral)  Resp 16  SpO2 98%  Physical Exam  Nursing note and vitals reviewed. Constitutional: She appears well-developed and well-nourished.  HENT:  Head: Normocephalic and atraumatic.  Right Ear: External ear normal.  Left Ear: External ear normal.  Nose: Nose normal.  Eyes: Conjunctivae and EOM are normal.  Neck: Neck supple. No tracheal deviation present.  Pulmonary/Chest: Effort normal. No stridor. No respiratory distress.  Musculoskeletal: She exhibits no edema and no tenderness.       Lumbar back: She exhibits decreased range of motion, tenderness, pain and spasm. She exhibits no swelling and no edema.  Neurological: She is alert. She is not disoriented. No cranial nerve deficit or sensory deficit. She exhibits normal muscle tone. Coordination normal.       5 out of 5 strength bilateral lower extremities plantar flexion, and dorsi flexion, normal sensation  Skin: Skin is warm and dry. No rash noted. She is not diaphoretic. No erythema.  Psychiatric: She has a normal mood and affect. Her behavior is normal. Thought content normal.    ED Course  Procedures (including critical care time)  Labs  Reviewed - No data to display Dg Lumbar Spine Complete  08/20/2011  *RADIOLOGY REPORT*  Clinical Data: Fall.  Back injury.  Left-sided low back pain.  LUMBAR SPINE - COMPLETE 4+ VIEW  Comparison:  None.  Findings:  There is no evidence of lumbar spine fracture. Alignment is normal.  Intervertebral disc spaces are maintained.  IMPRESSION: Negative.  Original Report Authenticated By: Danae Orleans, M.D.     1. Lumbar strain       MDM  No sign of acute neurological or vascular emergency associated with pt's back pain.   Safe for outpatient follow up.         Celene Kras, MD 08/20/11 [phone]

## 2011-12-19 ENCOUNTER — Encounter (HOSPITAL_COMMUNITY): Payer: Self-pay | Admitting: *Deleted

## 2011-12-19 ENCOUNTER — Inpatient Hospital Stay (HOSPITAL_COMMUNITY)
Admission: EM | Admit: 2011-12-19 | Discharge: 2011-12-22 | DRG: 872 | Disposition: A | Discharge status: Home / Self Care | Payer: Medicare Other | Attending: Internal Medicine | Admitting: Internal Medicine

## 2011-12-19 ENCOUNTER — Emergency Department (HOSPITAL_COMMUNITY): Payer: Medicare Other

## 2011-12-19 DIAGNOSIS — I10 Essential (primary) hypertension: Secondary | ICD-10-CM | POA: Diagnosis present

## 2011-12-19 DIAGNOSIS — Z87898 Personal history of other specified conditions: Secondary | ICD-10-CM

## 2011-12-19 DIAGNOSIS — I959 Hypotension, unspecified: Secondary | ICD-10-CM | POA: Diagnosis present

## 2011-12-19 DIAGNOSIS — F329 Major depressive disorder, single episode, unspecified: Secondary | ICD-10-CM | POA: Diagnosis present

## 2011-12-19 DIAGNOSIS — N390 Urinary tract infection, site not specified: Secondary | ICD-10-CM

## 2011-12-19 DIAGNOSIS — M797 Fibromyalgia: Secondary | ICD-10-CM | POA: Diagnosis present

## 2011-12-19 DIAGNOSIS — F411 Generalized anxiety disorder: Secondary | ICD-10-CM | POA: Diagnosis present

## 2011-12-19 DIAGNOSIS — D649 Anemia, unspecified: Secondary | ICD-10-CM | POA: Diagnosis present

## 2011-12-19 DIAGNOSIS — F3289 Other specified depressive episodes: Secondary | ICD-10-CM | POA: Diagnosis present

## 2011-12-19 DIAGNOSIS — D509 Iron deficiency anemia, unspecified: Secondary | ICD-10-CM | POA: Diagnosis present

## 2011-12-19 DIAGNOSIS — N12 Tubulo-interstitial nephritis, not specified as acute or chronic: Secondary | ICD-10-CM | POA: Diagnosis present

## 2011-12-19 DIAGNOSIS — E039 Hypothyroidism, unspecified: Secondary | ICD-10-CM | POA: Diagnosis present

## 2011-12-19 DIAGNOSIS — R410 Disorientation, unspecified: Secondary | ICD-10-CM

## 2011-12-19 DIAGNOSIS — M549 Dorsalgia, unspecified: Secondary | ICD-10-CM | POA: Diagnosis present

## 2011-12-19 DIAGNOSIS — G8929 Other chronic pain: Secondary | ICD-10-CM | POA: Diagnosis present

## 2011-12-19 DIAGNOSIS — A419 Sepsis, unspecified organism: Principal | ICD-10-CM | POA: Diagnosis present

## 2011-12-19 DIAGNOSIS — F419 Anxiety disorder, unspecified: Secondary | ICD-10-CM | POA: Diagnosis present

## 2011-12-19 DIAGNOSIS — IMO0001 Reserved for inherently not codable concepts without codable children: Secondary | ICD-10-CM | POA: Diagnosis present

## 2011-12-19 DIAGNOSIS — R413 Other amnesia: Secondary | ICD-10-CM

## 2011-12-19 DIAGNOSIS — R51 Headache: Secondary | ICD-10-CM | POA: Diagnosis present

## 2011-12-19 HISTORY — DX: Essential (primary) hypertension: I10

## 2011-12-19 HISTORY — DX: Disorientation, unspecified: R41.0

## 2011-12-19 HISTORY — DX: Other amnesia: R41.3

## 2011-12-19 HISTORY — DX: Spotted fever due to Rickettsia rickettsii: A77.0

## 2011-12-19 HISTORY — DX: Neuralgia and neuritis, unspecified: M79.2

## 2011-12-19 HISTORY — DX: Personal history of other infectious and parasitic diseases: Z86.19

## 2011-12-19 LAB — DIFFERENTIAL
Basophils Absolute: 0 10*3/uL (ref 0.0–0.1)
Basophils Relative: 0 % (ref 0–1)
Eosinophils Absolute: 0 10*3/uL (ref 0.0–0.7)
Lymphs Abs: 0.3 10*3/uL — ABNORMAL LOW (ref 0.7–4.0)
Monocytes Relative: 3 % (ref 3–12)
Neutro Abs: 4 10*3/uL (ref 1.7–7.7)
Neutrophils Relative %: 91 % — ABNORMAL HIGH (ref 43–77)

## 2011-12-19 LAB — URINE MICROSCOPIC-ADD ON

## 2011-12-19 LAB — BASIC METABOLIC PANEL
BUN: 14 mg/dL (ref 6–23)
Chloride: 103 mEq/L (ref 96–112)
GFR calc Af Amer: >90 mL/min (ref >90)
GFR calc non Af Amer: >90 mL/min (ref >90)
Potassium: 4.7 mEq/L (ref 3.5–5.1)
Sodium: 135 mEq/L (ref 135–145)

## 2011-12-19 LAB — CBC
Hemoglobin: 10.8 g/dL — ABNORMAL LOW (ref 12.0–15.0)
MCH: 32.7 pg (ref 26.0–34.0)
MCHC: 33.6 g/dL (ref 30.0–36.0)
Platelets: 164 10*3/uL (ref 150–400)
RBC: 3.3 MIL/uL — ABNORMAL LOW (ref 3.87–5.11)

## 2011-12-19 LAB — URINALYSIS, ROUTINE W REFLEX MICROSCOPIC
Bilirubin Urine: NEGATIVE
Nitrite: NEGATIVE
Specific Gravity, Urine: 1.016 (ref 1.005–1.030)
Urobilinogen, UA: 0.2 mg/dL (ref 0.0–1.0)
pH: 6 (ref 5.0–8.0)

## 2011-12-19 LAB — PREGNANCY, URINE: Preg Test, Ur: NEGATIVE

## 2011-12-19 LAB — PROCALCITONIN: Procalcitonin: 4.54 ng/mL

## 2011-12-19 MED ORDER — KETOROLAC TROMETHAMINE 30 MG/ML IJ SOLN
30.0000 mg | Freq: Once | INTRAMUSCULAR | Status: AC
  Administered 2011-12-19: 30 mg via INTRAVENOUS
  Filled 2011-12-19: qty 1

## 2011-12-19 MED ORDER — SODIUM CHLORIDE 0.9 % IV SOLN
Freq: Once | INTRAVENOUS | Status: AC
  Administered 2011-12-19: 17:00:00 via INTRAVENOUS

## 2011-12-19 MED ORDER — ZOLPIDEM TARTRATE 5 MG PO TABS
5.0000 mg | ORAL_TABLET | Freq: Every evening | ORAL | Status: DC | PRN
  Administered 2011-12-19: 5 mg via ORAL
  Filled 2011-12-19 (×2): qty 1

## 2011-12-19 MED ORDER — VANCOMYCIN HCL 1000 MG IV SOLR
750.0000 mg | Freq: Two times a day (BID) | INTRAVENOUS | Status: DC
  Administered 2011-12-19 – 2011-12-20 (×2): 750 mg via INTRAVENOUS
  Filled 2011-12-19 (×2): qty 750

## 2011-12-19 MED ORDER — ACETAMINOPHEN 325 MG PO TABS
650.0000 mg | ORAL_TABLET | Freq: Once | ORAL | Status: AC
  Administered 2011-12-19: 650 mg via ORAL
  Filled 2011-12-19: qty 2

## 2011-12-19 MED ORDER — SODIUM CHLORIDE 0.9 % IV BOLUS (SEPSIS): 1000.0000 mL | Freq: Once | INTRAVENOUS | Status: DC

## 2011-12-19 MED ORDER — DEXTROSE 5 % IV SOLN
1.0000 g | Freq: Once | INTRAVENOUS | Status: AC
  Administered 2011-12-19: 1 g via INTRAVENOUS
  Filled 2011-12-19: qty 10

## 2011-12-19 MED ORDER — HYDROCODONE-ACETAMINOPHEN 10-325 MG PO TABS
1.0000 | ORAL_TABLET | ORAL | Status: DC | PRN
  Administered 2011-12-19: 1 via ORAL
  Filled 2011-12-19 (×2): qty 1

## 2011-12-19 MED ORDER — SODIUM CHLORIDE 0.9 % IV BOLUS (SEPSIS)
1000.0000 mL | Freq: Once | INTRAVENOUS | Status: AC
  Administered 2011-12-19: 1000 mL via INTRAVENOUS

## 2011-12-19 MED ORDER — MORPHINE SULFATE 4 MG/ML IJ SOLN
4.0000 mg | Freq: Once | INTRAMUSCULAR | Status: AC
  Administered 2011-12-19: 4 mg via INTRAVENOUS
  Filled 2011-12-19: qty 1

## 2011-12-19 MED ORDER — DOXYCYCLINE HYCLATE 100 MG IV SOLR
100.0000 mg | Freq: Two times a day (BID) | INTRAVENOUS | Status: DC
  Administered 2011-12-19: 100 mg via INTRAVENOUS
  Filled 2011-12-19 (×3): qty 100

## 2011-12-19 MED ORDER — PIPERACILLIN-TAZOBACTAM 3.375 G IVPB
3.3750 g | Freq: Three times a day (TID) | INTRAVENOUS | Status: DC
  Administered 2011-12-19 – 2011-12-20 (×2): 3.375 g via INTRAVENOUS
  Filled 2011-12-19 (×3): qty 50

## 2011-12-19 NOTE — ED Notes (Signed)
Pt to xray  Pt alert and oriented x4. Respirations even and unlabored, bilateral symmetrical rise and fall of chest. Skin warm and dry. In no acute distress. Denies needs.   

## 2011-12-19 NOTE — ED Notes (Signed)
Attempted to call report; RN unavailable. Will call back.

## 2011-12-19 NOTE — ED Notes (Signed)
Per ems pt is from home. Alert and oriented x4, ambulatory. C/o 4-5 days of flu like symptoms. Today pt started feeling more weak and numbness in both hands. Reported "not being able to breathe well, and that her diaphragm is sore." headache 10/10

## 2011-12-19 NOTE — H&P (Addendum)
Kimberly [name] is an 46 y.o. female.   PCP - Dr.Little in [place]. Chief Complaint: Fever. HPI: 46 year-old female with history of hypertension, hypothyroidism, fibromyalgia, anxiety has been experiencing fever chills with right sided flank pain for the last 3-4 days. Patient started having some back pain 2 weeks ago after she slipped and fell. 3-4 days ago patient started developing right flank pain with fever and chills and headache. Patient also had the same time has been having multiple episodes of watery diarrhea. Patient also has been having generalized myalgia. Her headache is bifrontal and occipital and persistent. She did not experience any focal deficits blurred vision nausea vomiting. Since her symptoms have been persistent she had come to the ER. In the ER patient was found to be hypotensive and already 3 L of fluids have been given. Urinalysis shows features compatible with UTI. Patient has been given ceftriaxone and is admitted for further management. Patient denies any chest pain, shortness of breath, cough or phlegm. Denies any rash on the body. Patient states she was once treated for [facility] spot fever many years ago. She states she moves around in the woods a lot but does not recall any insect bites.  Past Medical History  Diagnosis Date  . Anxiety   . Depression   . Fibromyalgia   . Back pain, chronic   . Hypertension     History reviewed. No pertinent past surgical history.  History reviewed. No pertinent family history. Social History:  reports that she has never smoked. She does not have any smokeless tobacco history on file. She reports that she drinks alcohol. She reports that she does not use illicit drugs.  Allergies:  Allergies  Allergen Reactions  . Gabapentin Other (See Comments)    Pt tried to commit suicide  . Zoloft Other (See Comments)    unknown     (Not in a hospital admission)  Results for orders placed during the hospital encounter of  12/19/11 (from the past 48 hour(s))  CBC     Status: Abnormal   Collection Time   12/19/11  4:30 PM      Component Value Range Comment   WBC 4.4  4.0 - 10.5 (K/uL)    RBC 3.30 (*) 3.87 - 5.11 (MIL/uL)    Hemoglobin 10.8 (*) 12.0 - 15.0 (g/dL)    HCT 62.1 (*) 30.8 - 46.0 (%)    MCV 97.3  78.0 - 100.0 (fL)    MCH 32.7  26.0 - 34.0 (pg)    MCHC 33.6  30.0 - 36.0 (g/dL)    RDW 65.7  84.6 - 96.2 (%)    Platelets 164  150 - 400 (K/uL)   DIFFERENTIAL     Status: Abnormal   Collection Time   12/19/11  4:30 PM      Component Value Range Comment   Neutrophils Relative 91 (*) 43 - 77 (%)    Neutro Abs 4.0  1.7 - 7.7 (K/uL)    Lymphocytes Relative 7 (*) 12 - 46 (%)    Lymphs Abs 0.3 (*) 0.7 - 4.0 (K/uL)    Monocytes Relative 3  3 - 12 (%)    Monocytes Absolute 0.1  0.1 - 1.0 (K/uL)    Eosinophils Relative 0  0 - 5 (%)    Eosinophils Absolute 0.0  0.0 - 0.7 (K/uL)    Basophils Relative 0  0 - 1 (%)    Basophils Absolute 0.0  0.0 - 0.1 (K/uL)  BASIC METABOLIC PANEL     Status: Abnormal   Collection Time   12/19/11  4:30 PM      Component Value Range Comment   Sodium 135  135 - 145 (mEq/L)    Potassium 4.7  3.5 - 5.1 (mEq/L) MODERATE HEMOLYSIS   Chloride 103  96 - 112 (mEq/L)    CO2 22  19 - 32 (mEq/L)    Glucose, Bld 104 (*) 70 - 99 (mg/dL)    BUN 14  6 - 23 (mg/dL)    Creatinine, Ser 4.54  0.50 - 1.10 (mg/dL)    Calcium 8.3 (*) 8.4 - 10.5 (mg/dL)    GFR calc non Af Amer >90  >90 (mL/min)    GFR calc Af Amer >90  >90 (mL/min)   URINALYSIS, ROUTINE W REFLEX MICROSCOPIC     Status: Abnormal   Collection Time   12/19/11  4:49 PM      Component Value Range Comment   Color, Urine YELLOW  YELLOW     APPearance CLOUDY (*) CLEAR     Specific Gravity, Urine 1.016  1.005 - 1.030     pH 6.0  5.0 - 8.0     Glucose, UA NEGATIVE  NEGATIVE (mg/dL)    Hgb urine dipstick NEGATIVE  NEGATIVE     Bilirubin Urine NEGATIVE  NEGATIVE     Ketones, ur NEGATIVE  NEGATIVE (mg/dL)    Protein, ur 30 (*)  NEGATIVE (mg/dL)    Urobilinogen, UA 0.2  0.0 - 1.0 (mg/dL)    Nitrite NEGATIVE  NEGATIVE     Leukocytes, UA SMALL (*) NEGATIVE    URINE MICROSCOPIC-ADD ON     Status: Abnormal   Collection Time   12/19/11  4:49 PM      Component Value Range Comment   Squamous Epithelial / LPF FEW (*) RARE     WBC, UA 11-20  <3 (WBC/hpf)    RBC / HPF 3-6  <3 (RBC/hpf)    Bacteria, UA MANY (*) RARE     Dg Chest 2 View  12/19/2011  *RADIOLOGY REPORT*  Clinical Data: Shortness of breath and fever  CHEST - 2 VIEW  Comparison: 07/05/2011  Findings: Cardiomediastinal silhouette is within normal limits. The lungs are clear. No pleural effusion.  No pneumothorax.  No acute osseous abnormality.  IMPRESSION: Normal chest.  Original Report Authenticated By: Harrel Lemon, M.D.    Review of Systems  Constitutional: Positive for fever and chills.  Eyes: Negative.   Cardiovascular: Negative.   Gastrointestinal: Positive for diarrhea.  Genitourinary: Positive for flank pain.  Musculoskeletal: Positive for myalgias.  Neurological: Positive for headaches.  Endo/Heme/Allergies: Negative.   Psychiatric/Behavioral: Negative.     Blood pressure 92/44, pulse 82, temperature 98.9 F (37.2 C), temperature source Oral, resp. rate 18, height 5\' 2"  (1.575 m), weight 64.864 kg (143 lb), SpO2 95.00%. Physical Exam  Constitutional: She is oriented to person, place, and time. She appears well-developed and well-nourished. No distress.  HENT:  Head: Normocephalic and atraumatic.  Right Ear: External ear normal.  Left Ear: External ear normal.  Nose: Nose normal.  Mouth/Throat: Oropharynx is clear and moist. No oropharyngeal exudate.  Eyes: Conjunctivae are normal. Pupils are equal, round, and reactive to light. Right eye exhibits no discharge. Left eye exhibits no discharge. No scleral icterus.  Neck: Normal range of motion. Neck supple.  Cardiovascular: Normal rate, regular rhythm and normal heart sounds.     Respiratory: Effort normal and breath sounds normal. No respiratory  distress. She has no wheezes. She has no rales.  GI: Soft. Bowel sounds are normal. She exhibits no distension. There is no rebound and no guarding.       Right flank tenderness.  Musculoskeletal: Normal range of motion. She exhibits no edema and no tenderness.  Neurological: She is alert and oriented to person, place, and time.       Moves all extremities.  Skin: Skin is warm and dry. No rash noted. She is not diaphoretic. No erythema.  Psychiatric: Her behavior is normal.     Assessment/Plan #1. Sepsis most likely source is pyelonephritis - at this time blood cultures urine cultures procalcitonin and lactic acid has been ordered. We will keep patient on empiric antibiotics including vancomycin, Zosyn and will also add doxycycline given the history of being in the woods. Check RMSF titer. Since patient is also complaining of diarrhea we will get stool cultures and C. difficile PCR. Patient denies being on any antibiotics recently. Get CT of the abdomen and pelvis to rule out any hydronephrosis or obstruction or any other causes. If there is renal obstruction then we will consult urology. Check lipase. Since patient is complaining of headache we will get also CT head without contrast. #2. History of hypertension - presently hypotensive so we will hold off all antihypertensives. #3. History of hypothyroidism - continue Synthroid. #4. History of fibromyalgia/depression/anxiety/chronic low back pain. #5. Anemia - follow CBC closely.  Further recommendations based on clinical course and tests ordered. CODE STATUS - full code.  Eduard Clos 12/19/2011, 8:08 PM

## 2011-12-19 NOTE — ED Provider Notes (Signed)
History     CSN: [id number]  Arrival date & time 12/19/11  1558   First MD Initiated Contact with Patient 12/19/11 1608      Chief Complaint  Patient presents with  . Influenza    (Consider location/radiation/quality/duration/timing/severity/associated sxs/prior treatment) Patient is a 47 y.o. female presenting with flu symptoms. The history is provided by the patient.  Influenza Associated symptoms include headaches. Pertinent negatives include no chest pain, no abdominal pain and no shortness of breath.   the patient is a 46 year old, female, who presents to emergency department complaining of a headache, fever, and myalgias.  Yesterday.  She had right-sided flank pain, as well.  She says when she stands up.  She has nausea, but while she is lying down.  She does not have nausea.  She had diarrhea.  A few days ago.  However, it is resolved.  She denies sore throat, earaches, cough, or difficulty breathing.  She denies dysuria or frequency.  She has not had a rash.  She denies exposure to anybody with similar symptoms.  Past Medical History  Diagnosis Date  . Anxiety   . Depression   . Fibromyalgia   . Back pain, chronic   . Hypertension     History reviewed. No pertinent past surgical history.  History reviewed. No pertinent family history.  History  Substance Use Topics  . Smoking status: Never Smoker   . Smokeless tobacco: Not on file  . Alcohol Use: Yes     occ    OB History    Grav Para Term Preterm Abortions TAB SAB Ect Mult Living                  Review of Systems  Constitutional: Positive for fatigue. Negative for chills.  HENT: Negative for sore throat.   Respiratory: Negative for cough and shortness of breath.   Cardiovascular: Negative for chest pain.  Gastrointestinal: Positive for nausea and diarrhea. Negative for vomiting and abdominal pain.       Nausea and diarrhea are resolved  Genitourinary: Positive for flank pain.       Flank pain.   Resolved  Musculoskeletal: Positive for myalgias.  Skin: Negative for rash.  Neurological: Positive for headaches.  Psychiatric/Behavioral: Negative for confusion.  All other systems reviewed and are negative.    Allergies  Gabapentin and Zoloft  Home Medications   Current Outpatient Rx  Name Route Sig Dispense Refill  . BISOPROLOL-HYDROCHLOROTHIAZIDE 5-6.25 MG PO TABS Oral Take 1 tablet by mouth daily.    Marland Kitchen CARISOPRODOL 350 MG PO TABS Oral Take 350 mg by mouth 3 (three) times daily.    Marland Kitchen CLONAZEPAM 2 MG PO TABS Oral Take 2 mg by mouth 2 (two) times daily as needed. anxiety    . DULOXETINE HCL 60 MG PO CPEP Oral Take 60 mg by mouth 2 (two) times daily.      Marland Kitchen HYDROCODONE-ACETAMINOPHEN 10-325 MG PO TABS Oral Take 1 tablet by mouth every 4 (four) hours as needed. For pain 16 tablet 0  . LEVOTHYROXINE SODIUM 100 MCG PO TABS Oral Take 100 mcg by mouth daily.        BP 91/43  Pulse 89  Temp(Src) 101 F (38.3 C) (Oral)  Resp 16  SpO2 96%  Physical Exam  Vitals reviewed. Constitutional: She is oriented to person, place, and time. She appears well-developed and well-nourished.  HENT:  Head: Normocephalic and atraumatic.  Eyes: Conjunctivae are normal.  Neck: Normal range of motion. Neck  supple.  Cardiovascular: Normal rate.   No murmur heard. Pulmonary/Chest: Effort normal. No respiratory distress.  Abdominal: Soft. Bowel sounds are normal. There is no tenderness. There is no rebound.  Genitourinary:       No CVA tenderness  Musculoskeletal: Normal range of motion. She exhibits no edema.  Neurological: She is alert and oriented to person, place, and time. No cranial nerve deficit.  Skin: Skin is warm and dry.  Psychiatric: She has a normal mood and affect. Thought content normal.    ED Course  Procedures (including critical care time) 46 year old, female, with fever, and myalgias, with no other localizing symptoms.  She got a normal mental status.  No rash.  A normal  neurological examination.  There is no evidence of CNS infection.  We will perform a chest x-ray, and laboratory testing, to try to determine the etiology for her fever and treat her symptoms in the emergency department with analgesics and antipyretics   Labs Reviewed  CBC  DIFFERENTIAL  BASIC METABOLIC PANEL  URINALYSIS, ROUTINE W REFLEX MICROSCOPIC   No results found.   No diagnosis found.  CRITICAL CARE Performed by: Nicholes Stairs   Total critical care time: 30 min  Critical care time was exclusive of separately billable procedures and treating other patients.  Critical care was necessary to treat or prevent imminent or life-threatening deterioration.  Critical care was time spent personally by me on the following activities: development of treatment plan with patient and/or surrogate as well as nursing, discussions with consultants, evaluation of patient's response to treatment, examination of patient, obtaining history from patient or surrogate, ordering and performing treatments and interventions, ordering and review of laboratory studies, ordering and review of radiographic studies, pulse oximetry and re-evaluation of patient's condition.  Ivf > 3 L boluses Iv abxs Consult triad Iv pain meds   MDM  Fever Hypotension Pain uti        Cheri Guppy, MD 12/19/11 1941

## 2011-12-19 NOTE — Progress Notes (Signed)
ANTIBIOTIC CONSULT NOTE - INITIAL  Pharmacy Consult for: Vancomycin/Zosyn Indication: Sepsis  Allergies  Allergen Reactions  . Gabapentin Other (See Comments)    Pt tried to commit suicide  . Zoloft Other (See Comments)    unknown    Patient Measurements:   Vital Signs: Temp: 98.9 F (37.2 C) (04/25 1850) Temp src: Oral (04/25 1850) BP: 92/44 mmHg (04/25 1850) Pulse Rate: 82  (04/25 1850) Intake/Output from previous day:   Intake/Output from this shift:    Labs:  Basename 12/19/11 1630  WBC 4.4  HGB 10.8*  PLT 164  LABCREA --  CREATININE 0.69   CrCl is unknown because there is no height on file for the current visit. No results found for this basename: VANCOTROUGH:2,VANCOPEAK:2,VANCORANDOM:2,GENTTROUGH:2,GENTPEAK:2,GENTRANDOM:2,TOBRATROUGH:2,TOBRAPEAK:2,TOBRARND:2,AMIKACINPEAK:2,AMIKACINTROU:2,AMIKACIN:2, in the last 72 hours   Microbiology: No results found for this or any previous visit (from the past 720 hour(s)).  Medical History: Past Medical History  Diagnosis Date  . Anxiety   . Depression   . Fibromyalgia   . Back pain, chronic   . Hypertension     Medications:  Scheduled:    . sodium chloride   Intravenous Once  . sodium chloride   Intravenous Once  . acetaminophen  650 mg Oral Once  . cefTRIAXone (ROCEPHIN)  IV  1 g Intravenous Once  . doxycycline (VIBRAMYCIN) IV  100 mg Intravenous Q12H  . ketorolac  30 mg Intravenous Once  .  morphine injection  4 mg Intravenous Once  . sodium chloride  1,000 mL Intravenous Once  . sodium chloride  1,000 mL Intravenous Once  . sodium chloride  1,000 mL Intravenous Once   Assessment:  46 yo M to start vancomycin and zosyn for sepsis  WBC WNL; Afebrile  SCr 0.69 with CrCl 77 ml/min  Urine and blood cultures pending  Goal of Therapy:  Vancomycin trough level 15-20 mcg/ml Zosyn per renal function  Plan:  1.) Zosyn 3.375 gm IV q8h infuse over 4 hours 2.) Vancomycin 750 mg IV q12h 3.) Monitor  renal functions 4.) Follow cultures 5.) Check vancomycin levels as needed  Cassie Henkels, Loma Messing PharmD 7:59 PM 12/19/2011

## 2011-12-19 NOTE — ED Notes (Signed)
EXB:MW41<LK> Expected date:12/19/11<BR> Expected time: 3:45 PM<BR> Means of arrival:Ambulance<BR> Comments:<BR> M51. 45 f. FLU LIKE SX. 5 days. 20 mins

## 2011-12-20 DIAGNOSIS — D649 Anemia, unspecified: Secondary | ICD-10-CM | POA: Diagnosis present

## 2011-12-20 DIAGNOSIS — F419 Anxiety disorder, unspecified: Secondary | ICD-10-CM | POA: Diagnosis present

## 2011-12-20 LAB — RETICULOCYTES: Retic Ct Pct: 0.8 % (ref 0.4–3.1)

## 2011-12-20 LAB — ROCKY MTN SPOTTED FVR AB, IGM-BLOOD: RMSF IgM: 0.24 IV (ref 0.00–0.89)

## 2011-12-20 LAB — CK: Total CK: 46 U/L (ref 7–177)

## 2011-12-20 MED ORDER — DEXTROSE 5 % IV SOLN
1.0000 g | Freq: Once | INTRAVENOUS | Status: DC
  Filled 2011-12-20: qty 10

## 2011-12-20 MED ORDER — TRAZODONE HCL 150 MG PO TABS
150.0000 mg | ORAL_TABLET | Freq: Every evening | ORAL | Status: DC | PRN
  Filled 2011-12-20: qty 1

## 2011-12-20 MED ORDER — HYDROCODONE-ACETAMINOPHEN 10-325 MG PO TABS
1.0000 | ORAL_TABLET | ORAL | Status: DC | PRN
  Administered 2011-12-20: 1 via ORAL
  Administered 2011-12-20 (×3): 2 via ORAL
  Administered 2011-12-20 (×2): 1 via ORAL
  Administered 2011-12-21: 2 via ORAL
  Administered 2011-12-21 (×2): 1 via ORAL
  Administered 2011-12-21 – 2011-12-22 (×4): 2 via ORAL
  Filled 2011-12-20 (×5): qty 2
  Filled 2011-12-20: qty 1
  Filled 2011-12-20: qty 2
  Filled 2011-12-20: qty 1
  Filled 2011-12-20 (×4): qty 2

## 2011-12-20 MED ORDER — CLONAZEPAM 1 MG PO TABS
2.0000 mg | ORAL_TABLET | Freq: Two times a day (BID) | ORAL | Status: DC | PRN
  Administered 2011-12-20 – 2011-12-21 (×3): 2 mg via ORAL
  Filled 2011-12-20: qty 1
  Filled 2011-12-20: qty 2
  Filled 2011-12-20: qty 1
  Filled 2011-12-20: qty 2

## 2011-12-20 MED ORDER — ZOLPIDEM TARTRATE 10 MG PO TABS
10.0000 mg | ORAL_TABLET | Freq: Every evening | ORAL | Status: DC | PRN
  Administered 2011-12-20: 5 mg via ORAL

## 2011-12-20 MED ORDER — SODIUM CHLORIDE 0.9 % IJ SOLN
3.0000 mL | Freq: Two times a day (BID) | INTRAMUSCULAR | Status: DC
  Administered 2011-12-20: 3 mL via INTRAVENOUS

## 2011-12-20 MED ORDER — ONDANSETRON HCL 4 MG PO TABS: 4.0000 mg | ORAL_TABLET | Freq: Four times a day (QID) | ORAL | Status: DC | PRN

## 2011-12-20 MED ORDER — ZOLPIDEM TARTRATE 10 MG PO TABS
10.0000 mg | ORAL_TABLET | Freq: Every evening | ORAL | Status: DC | PRN
  Administered 2011-12-20 – 2011-12-21 (×2): 10 mg via ORAL
  Filled 2011-12-20 (×2): qty 1

## 2011-12-20 MED ORDER — SODIUM CHLORIDE 0.9 % IV SOLN
INTRAVENOUS | Status: DC
  Administered 2011-12-20: 16:00:00 via INTRAVENOUS
  Administered 2011-12-21: 1000 mL via INTRAVENOUS
  Administered 2011-12-21: 75 mL/h via INTRAVENOUS
  Administered 2011-12-22: 07:00:00 via INTRAVENOUS

## 2011-12-20 MED ORDER — KETOROLAC TROMETHAMINE 30 MG/ML IJ SOLN
30.0000 mg | Freq: Four times a day (QID) | INTRAMUSCULAR | Status: DC | PRN
  Administered 2011-12-20: 30 mg via INTRAVENOUS
  Filled 2011-12-20: qty 1

## 2011-12-20 MED ORDER — ACETAMINOPHEN 325 MG PO TABS: 650.0000 mg | ORAL_TABLET | Freq: Four times a day (QID) | ORAL | Status: DC | PRN

## 2011-12-20 MED ORDER — DEXTROSE 5 % IV SOLN
1.0000 g | Freq: Once | INTRAVENOUS | Status: AC
  Administered 2011-12-20: 1 g via INTRAVENOUS
  Filled 2011-12-20 (×3): qty 10

## 2011-12-20 MED ORDER — LEVOTHYROXINE SODIUM 100 MCG PO TABS
100.0000 ug | ORAL_TABLET | Freq: Every day | ORAL | Status: DC
  Administered 2011-12-20 – 2011-12-22 (×3): 100 ug via ORAL
  Filled 2011-12-20 (×5): qty 1

## 2011-12-20 MED ORDER — DULOXETINE HCL 60 MG PO CPEP
60.0000 mg | ORAL_CAPSULE | Freq: Two times a day (BID) | ORAL | Status: DC
  Administered 2011-12-20 – 2011-12-22 (×5): 60 mg via ORAL
  Filled 2011-12-20 (×10): qty 1

## 2011-12-20 MED ORDER — ACETAMINOPHEN 650 MG RE SUPP: 650.0000 mg | Freq: Four times a day (QID) | RECTAL | Status: DC | PRN

## 2011-12-20 MED ORDER — ONDANSETRON HCL 4 MG/2ML IJ SOLN: 4.0000 mg | Freq: Four times a day (QID) | INTRAMUSCULAR | Status: DC | PRN

## 2011-12-20 NOTE — Progress Notes (Addendum)
PROGRESS NOTE  RHEMI BALBACH WUJ:[id number] DOB: March 20, 1966 DOA: 12/19/2011 PCP: Aida Puffer, MD, MD  Brief narrative: Kimberly [name] is a 46 year old female with a past medical history of hypertension, hypothyroidism, fibromyalgia, and generalized anxiety disorder as well as a remote history of [place] spotted fever who presented to the hospital on 12/20/2011 with chief complaint of right-sided flank pain, diarrhea, myalgias, and headache. Upon initial presentation, she was hypotensive and required fluid volume resuscitation. She was felt to be septic with the most likely source being pyelonephritis.  Assessment/Plan: Principal Problem:  *Sepsis secondary to pyelonephritis  CT scan of the abdomen and pelvis showed right perinephric stranding consistent with pyelonephritis.  Pro calcitonin elevated but lactic acid within the normal range.  Given a dose of Rocephin with subsequent broadening of antibiotics as noted below.  Would narrow antibiotics to Rocephin and followup with urine cultures. Active Problems:  HTN (hypertension)  Hypotensive on admission  Blood pressure medications currently on hold.  Hypothyroidism  Continue Synthroid.  Fibromyalgia  Continue Cymbalta.  Add Toradol for breakthrough pain.  Anxiety  Continue Klonopin.  Ambien not helpful for sleep, so will stop this and try trazodone.  Normocytic anemia  May be due to acute illness.  Monitor.   Code Status: Full Family Communication: None at bedside. Disposition Plan: Home when stable.  Medical Consultants:  None  Other consultants:  None  Antibiotics:  Doxycycline 12/19/11--->12/20/11  Vancomycin 12/19/11--->12/20/11  Zosyn 12/19/11--->12/20/11  Rocephin 12/19/11--->   Subjective  Mrs. Miralles complains of headache pain and pain in multiple areas of her body. She is anxious and states she did not sleep well last night. No nausea or vomiting overnight. No diarrhea overnight.   Objective    Interim History: Stable overnight.   Objective: Filed Vitals:   12/20/11 0500 12/20/11 0600 12/20/11 0700 12/20/11 0800  BP: 100/86 121/67 115/64 133/78  Pulse: 82 81 81 81  Temp:      TempSrc:      Resp: 17 24 17 25   Height:      Weight:      SpO2: 100% 97%  97%    Intake/Output Summary (Last 24 hours) at 12/20/11 0842 Last data filed at 12/20/11 0700  Gross per 24 hour  Intake   4790 ml  Output   2350 ml  Net   2440 ml    Exam: Gen:  NAD Cardiovascular:  RRR, No M/R/G Respiratory: Lungs CTAB Gastrointestinal: Abdomen soft, NT/ND with normal active bowel sounds. Extremities: No C/E/C   Data Reviewed: Basic Metabolic Panel:  Lab 12/19/11 9562  NA 135  K 4.7  CL 103  CO2 22  GLUCOSE 104*  BUN 14  CREATININE 0.69  CALCIUM 8.3*  MG --  PHOS --   GFR Estimated Creatinine Clearance: 76.9 ml/min (by C-G formula based on Cr of 0.69).  Lab 12/19/11 2007  LIPASE 14  AMYLASE --    CBC:  Lab 12/19/11 1630  WBC 4.4  NEUTROABS 4.0  HGB 10.8*  HCT 32.1*  MCV 97.3  PLT 164   Sepsis evaluation:  Ref. Range 12/19/2011 20:01  Lactic Acid, Venous Latest Range: 0.5-2.2 mmol/L 0.7  Procalcitonin No range found 4.54   Microbiology Recent Results (from the past 240 hour(s))  MRSA PCR SCREENING     Status: Normal   Collection Time   12/19/11 10:49 PM      Component Value Range Status Comment   MRSA by PCR NEGATIVE  NEGATIVE  Final     Procedures  and Diagnostic Studies:  Ct Abdomen Pelvis Wo Contrast 12/19/2011 IMPRESSION: Right sided perinephric stranding is associated with dilatation of the right ureter.  Although no ureteral calculi are present., these findings can be associated with a recently passed right ureteral calculus.  Also consider pyelonephritis.  Normal appendix.  Original Report Authenticated By: Donavan Burnet, M.D.    Dg Chest 2 View 12/19/2011 IMPRESSION: Normal chest.  Original Report Authenticated By: Harrel Lemon, M.D.      Ct Head Wo Contrast 12/19/2011  IMPRESSION: Negative head CT.    Scheduled Meds:    . sodium chloride   Intravenous Once  . sodium chloride   Intravenous Once  . acetaminophen  650 mg Oral Once  . cefTRIAXone (ROCEPHIN)  IV  1 g Intravenous Once  . cefTRIAXone (ROCEPHIN)  IV  1 g Intravenous Once  . DULoxetine  60 mg Oral BID  . ketorolac  30 mg Intravenous Once  . levothyroxine  100 mcg Oral Daily  .  morphine injection  4 mg Intravenous Once  . sodium chloride  1,000 mL Intravenous Once  . sodium chloride  1,000 mL Intravenous Once  . sodium chloride  1,000 mL Intravenous Once  . sodium chloride  3 mL Intravenous Q12H  . DISCONTD: doxycycline (VIBRAMYCIN) IV  100 mg Intravenous Q12H  . DISCONTD: piperacillin-tazobactam (ZOSYN)  IV  3.375 g Intravenous Q8H  . DISCONTD: vancomycin  750 mg Intravenous Q12H   Continuous Infusions:    . sodium chloride        LOS: 1 day   Hillery Aldo, MD Pager [phone]  12/20/2011, 8:42 AM   Patient Teaching (information given to and reviewed with the patient): AMYRA VANTUYL 12/20/2011  Treatment team:   Dr. Trula Ore Emogene Muratalla, Hospitalist (Internist)  Medical Issues and plan: Principal Problem:  *Infection causing low blood pressure thought to be due to kidney infection  CT scan of the abdomen and pelvis showed right sided infection of the kidney.  CT scan of the head was negative.  Initially put on broad-spectrum antibiotics.  Plan is to narrow antibiotics today and watch you another 24 hours. Active Problems:  High blood pressure  Blood pressure was low on admission.  Blood pressure medications currently on hold.  Under active thyroid gland  Continue Synthroid.  Fibromyalgia  Continue Cymbalta.    Add Toradol for breakthrough pain.  Anxiety  Continue Klonopin.  Ambien not helpful for sleep, so will stop this and try trazodone.  Low blood count (anemia)  May be due to acute  illness.  Monitor.  Anticipated discharge date: 1-2 days.

## 2011-12-20 NOTE — Progress Notes (Signed)
45 YO Caucasian female admitted through ED with R flank/ back pain 3-4 days, general malaise, headache, "flu-lioe  sx. Head CT negative, renal US showed passed kidney stone.  Dx pyelonephritits.  In stepdown unit due to SBP in 80s after  4L crystalloid and morphine administration. BP 110/60s, hydrocodone 10 mg po and ambien 10 mg po given for pain and insomnia. Hx of fibromyalgia and chronic pain, and remote [place] fever.Updates to Dr Toniann Fail by phone. Marland Kitchen

## 2011-12-20 NOTE — Progress Notes (Signed)
CARE MANAGEMENT NOTE 12/20/2011  Patient:  Kimberly [name], Kimberly [name]   Account Number:  [id number]  Date Initiated:  12/20/2011  Documentation initiated by:  Eudelia Hiltunen  Subjective/Objective Assessment:   female admitted into sdu for poss pyleronephritis and hypotension in [name] normally hypertensive patient, cystalloids rescutation in the er and sdu.     Action/Plan:   lives at  home does have [name] support group   Anticipated DC Date:  12/23/2011   Anticipated DC Plan:  HOME/SELF CARE  In-house referral  NA      DC Planning Services  NA      [facility] Choice  NA   Choice offered to / List presented to:  NA   DME arranged  NA      DME agency  NA     HH arranged  NA      HH agency  NA   Status of service:  In process, will continue to follow Medicare Important Message given?  YES (If response is "NO", the following Medicare IM given date fields will be blank) Date Medicare IM given:  12/19/2011 Date Additional Medicare IM given:    Discharge Disposition:    Per UR Regulation:    If discussed at Long Length of Stay Meetings, dates discussed:    Comments:  [id number]/Shakesha Soltau Lorrin Mais Case Management [phone]

## 2011-12-21 LAB — IRON AND TIBC: Iron: <10 ug/dL — ABNORMAL LOW (ref 42–135)

## 2011-12-21 LAB — DIFFERENTIAL
Basophils Relative: 0 % (ref 0–1)
Eosinophils Absolute: 0 10*3/uL (ref 0.0–0.7)
Eosinophils Relative: 1 % (ref 0–5)
Lymphocytes Relative: 18 % (ref 12–46)
Neutro Abs: 3.2 10*3/uL (ref 1.7–7.7)
Neutrophils Relative %: 71 % (ref 43–77)

## 2011-12-21 LAB — CBC
HCT: 29.5 % — ABNORMAL LOW (ref 36.0–46.0)
Hemoglobin: 9.9 g/dL — ABNORMAL LOW (ref 12.0–15.0)
MCH: 32.5 pg (ref 26.0–34.0)
MCHC: 33.6 g/dL (ref 30.0–36.0)
RBC: 3.05 MIL/uL — ABNORMAL LOW (ref 3.87–5.11)

## 2011-12-21 LAB — COMPREHENSIVE METABOLIC PANEL
ALT: 89 U/L — ABNORMAL HIGH (ref 0–35)
AST: 81 U/L — ABNORMAL HIGH (ref 0–37)
Calcium: 8.2 mg/dL — ABNORMAL LOW (ref 8.4–10.5)
Creatinine, Ser: 0.61 mg/dL (ref 0.50–1.10)
GFR calc Af Amer: >90 mL/min (ref >90)
Sodium: 137 mEq/L (ref 135–145)
Total Protein: 6.3 g/dL (ref 6.0–8.3)

## 2011-12-21 LAB — VITAMIN B12: Vitamin B-12: 302 pg/mL (ref 211–911)

## 2011-12-21 LAB — LACTIC ACID, PLASMA: Lactic Acid, Venous: 0.6 mmol/L (ref 0.5–2.2)

## 2011-12-21 NOTE — Progress Notes (Signed)
Patient requesting Remus Loffler. States trazodone gives her "severe nightmares" and she "would rather have [place] like she takes at home. NP paged orders received.

## 2011-12-21 NOTE — Progress Notes (Signed)
 PROGRESS NOTE  Kimberly [name]:[id number] DOB: 1966/03/07 DOA: 12/19/2011 PCP: Aida Puffer, MD, MD  Brief narrative: Kimberly [name] is a 46 year old female with a past medical history of hypertension, hypothyroidism, fibromyalgia, and generalized anxiety disorder as well as a remote history of [place] spotted fever who presented to the hospital on 12/20/2011 with chief complaint of right-sided flank pain, diarrhea, myalgias, and headache. Upon initial presentation, she was hypotensive and required fluid volume resuscitation. She was felt to be septic with the most likely source being pyelonephritis.  Assessment/Plan: Principal Problem:  *Sepsis secondary to pyelonephritis  CT scan of the abdomen and pelvis showed right perinephric stranding consistent with pyelonephritis.  Pro calcitonin elevated but lactic acid within the normal range.  Given a dose of Rocephin with subsequent broadening of antibiotics as noted below, with subsequent narrowing on 12/20/2011.  Preliminary urine cultures growing Escherichia coli.  Continue Rocephin and followup with urine cultures. Active Problems:  HTN (hypertension)  Hypotensive on admission.  Currently normotensive.  Blood pressure medications remain on hold.  Hypothyroidism  Continue Synthroid.  Fibromyalgia  Continue Cymbalta.  Added Toradol for breakthrough pain 12/20/2011, which she reports has helped.  Ordered K-pad for residual right flank pain.  Anxiety  Continue Klonopin.  Ambien not helpful for sleep, so will stop this and try trazodone.  Refused trazodone last night.  Normocytic anemia  May be due to acute illness.  Monitor.   Code Status: Full Family Communication: None at bedside. Disposition Plan: Home when stable.  Medical Consultants:  None  Other consultants:  None  Antibiotics:  Doxycycline 12/19/11--->12/20/11  Vancomycin 12/19/11--->12/20/11  Zosyn 12/19/11--->12/20/11  Rocephin  12/19/11--->   Subjective  Kimberly [name] continues to complain of headache pain and right-sided flank pain. Overall, she feels better. Appetite is still fair. No nausea, vomiting, or diarrhea.   Objective    Interim History: Stable overnight.   Objective: Filed Vitals:   12/20/11 2250 12/20/11 2300 12/21/11 0445 12/21/11 0815  BP:   136/30 132/80  Pulse: 98  94   Temp:  98.9 F (37.2 C) 98.3 F (36.8 C)   TempSrc:  Oral Oral   Resp:   18   Height:      Weight:   73.982 kg (163 lb 1.6 oz)   SpO2:   94%     Intake/Output Summary (Last 24 hours) at 12/21/11 0855 Last data filed at 12/21/11 2952  Gross per 24 hour  Intake 4914.94 ml  Output   3600 ml  Net 1314.94 ml    Exam: Gen:  NAD Cardiovascular:  RRR, No M/R/G Respiratory: Lungs CTAB Gastrointestinal: Abdomen soft, NT/ND with normal active bowel sounds. Extremities: No C/E/C   Data Reviewed: Basic Metabolic Panel:  Lab 12/21/11 8413 12/19/11 1630  NA 137 135  K 3.6 4.7  CL 105 103  CO2 24 22  GLUCOSE 105* 104*  BUN 5* 14  CREATININE 0.61 0.69  CALCIUM 8.2* 8.3*  MG -- --  PHOS -- --   GFR Estimated Creatinine Clearance: 82.9 ml/min (by C-G formula based on Cr of 0.61).  Lab 12/19/11 2007  LIPASE 14  AMYLASE --    CBC:  Lab 12/21/11 0412 12/19/11 1630  WBC 4.4 4.4  NEUTROABS 3.2 4.0  HGB 9.9* 10.8*  HCT 29.5* 32.1*  MCV 96.7 97.3  PLT 176 164   Sepsis evaluation:  Ref. Range 12/19/2011 20:01  Lactic Acid, Venous Latest Range: 0.5-2.2 mmol/L 0.7  Procalcitonin No range found 4.54   Microbiology Recent  Results (from the past 240 hour(s))  URINE CULTURE     Status: Normal (Preliminary result)   Collection Time   12/19/11  7:48 PM      Component Value Range Status Comment   Specimen Description URINE, CLEAN CATCH   Final    Special Requests NONE   Final    Culture  Setup Time [id number]   Final    Colony Count >=100,000 COLONIES/ML   Final    Culture ESCHERICHIA COLI   Final     Report Status PENDING   Incomplete   MRSA PCR SCREENING     Status: Normal   Collection Time   12/19/11 10:49 PM      Component Value Range Status Comment   MRSA by PCR NEGATIVE  NEGATIVE  Final     Procedures and Diagnostic Studies:  Ct Abdomen Pelvis Wo Contrast 12/19/2011 IMPRESSION: Right sided perinephric stranding is associated with dilatation of the right ureter.  Although no ureteral calculi are present., these findings can be associated with a recently passed right ureteral calculus.  Also consider pyelonephritis.  Normal appendix.  Original Report Authenticated By: Donavan Burnet, M.D.    Dg Chest 2 View 12/19/2011 IMPRESSION: Normal chest.  Original Report Authenticated By: Harrel Lemon, M.D.    Ct Head Wo Contrast 12/19/2011  IMPRESSION: Negative head CT.    Scheduled Meds:    . cefTRIAXone (ROCEPHIN)  IV  1 g Intravenous Once  . DULoxetine  60 mg Oral BID  . levothyroxine  100 mcg Oral Daily  . sodium chloride  1,000 mL Intravenous Once  . sodium chloride  3 mL Intravenous Q12H  . DISCONTD: cefTRIAXone (ROCEPHIN)  IV  1 g Intravenous Once   Continuous Infusions:    . sodium chloride 1,000 mL (12/21/11 0430)      LOS: 2 days   Hillery Aldo, MD Pager [phone]  12/21/2011, 8:55 AM   Patient Teaching (information given to and reviewed with the patient): Kimberly [name] 12/21/2011  Treatment team:   Dr. Trula Ore , Hospitalist (Internist)  Medical Issues and plan: Principal Problem:  *Infection causing low blood pressure thought to be due to kidney infection  CT scan of the abdomen and pelvis showed right sided infection of the kidney.  CT scan of the head was negative.  Initially put on broad-spectrum antibiotics, which were changed to Rocephin 12/20/2011.  Preliminary cultures showing Escherichia coli bacteria.  Will need final culture results before we can switch you over to by mouth antibiotics and send you home. Active  Problems:  High blood pressure  Blood pressure was low on admission.  Blood pressure medications currently on hold.  Blood pressure in the normal range today.  Under active thyroid gland  Continue Synthroid.  Fibromyalgia  Continue Cymbalta.    Continue as needed Vicodin, but try to limit your use of this as this is a habit-forming medication.  Added Toradol for breakthrough pain.  Anxiety  Continue Klonopin.  Ambien as needed for sleep.  Low blood count (anemia)  May be due to acute illness.  Monitor.  Anticipated discharge date: tomorrow.

## 2011-12-22 LAB — URINE CULTURE: Culture  Setup Time: 201304260144

## 2011-12-22 MED ORDER — BISOPROLOL-HYDROCHLOROTHIAZIDE 5-6.25 MG PO TABS
1.0000 | ORAL_TABLET | Freq: Every day | ORAL | Status: DC
  Administered 2011-12-22: 1 via ORAL
  Filled 2011-12-22: qty 1

## 2011-12-22 MED ORDER — CIPROFLOXACIN HCL 500 MG PO TABS: 500.0000 mg | ORAL_TABLET | Freq: Two times a day (BID) | ORAL | Status: AC

## 2011-12-22 MED ORDER — FLUCONAZOLE 150 MG PO TABS: 150.0000 mg | ORAL_TABLET | Freq: Once | ORAL | Status: AC

## 2011-12-22 MED ORDER — ONDANSETRON HCL 4 MG PO TABS: 4.0000 mg | ORAL_TABLET | Freq: Four times a day (QID) | ORAL | Status: AC | PRN

## 2011-12-22 MED ORDER — ZOLPIDEM TARTRATE 10 MG PO TABS: 10.0000 mg | ORAL_TABLET | Freq: Every evening | ORAL | Status: AC | PRN

## 2011-12-22 MED ORDER — HYDRALAZINE HCL 20 MG/ML IJ SOLN
5.0000 mg | Freq: Once | INTRAMUSCULAR | Status: AC
  Administered 2011-12-22: 5 mg via INTRAVENOUS
  Filled 2011-12-22: qty 1

## 2011-12-22 NOTE — Discharge Summary (Signed)
Physician Discharge Summary  Patient ID: Kimberly [name] MRN: [id number] DOB/AGE: February 17, 1966 46 y.o.  Admit date: 12/19/2011 Discharge date: 12/22/2011  Primary Care Physician:  Aida Puffer, MD, MD   Discharge Diagnoses:    Present on Admission:  .Sepsis .Pyelonephritis .HTN (hypertension) .Hypothyroidism .Fibromyalgia .Anxiety .Normocytic anemia  Discharge Medications:  Medication List  As of 12/22/2011  8:46 AM   TAKE these medications         bisoprolol-hydrochlorothiazide 5-6.25 MG per tablet   Commonly known as: ZIAC   Take 1 tablet by mouth daily.      carisoprodol 350 MG tablet   Commonly known as: SOMA   Take 350 mg by mouth 3 (three) times daily.      ciprofloxacin 500 MG tablet   Commonly known as: CIPRO   Take 1 tablet (500 mg total) by mouth 2 (two) times daily.      clonazePAM 2 MG tablet   Commonly known as: KLONOPIN   Take 2 mg by mouth 2 (two) times daily as needed. anxiety      DULoxetine 60 MG capsule   Commonly known as: CYMBALTA   Take 60 mg by mouth 2 (two) times daily.      fluconazole 150 MG tablet   Commonly known as: DIFLUCAN   Take 1 tablet (150 mg total) by mouth once.      HYDROcodone-acetaminophen 10-325 MG per tablet   Commonly known as: NORCO   Take 1 tablet by mouth every 4 (four) hours as needed. For pain      levothyroxine 100 MCG tablet   Commonly known as: SYNTHROID, LEVOTHROID   Take 100 mcg by mouth daily.      ondansetron 4 MG tablet   Commonly known as: ZOFRAN   Take 1 tablet (4 mg total) by mouth every 6 (six) hours as needed for nausea.      zolpidem 10 MG tablet   Commonly known as: AMBIEN   Take 1 tablet (10 mg total) by mouth at bedtime as needed for sleep.             Disposition and Follow-up: The patient is being discharged home. She is encouraged to followup with her primary care physician in 2 weeks or sooner, if needed.    Procedures and Diagnostic Studies:   Ct Abdomen Pelvis Wo Contrast  12/19/2011  *RADIOLOGY REPORT*  Clinical Data: Headache  CT HEAD WITHOUT CONTRAST,CT ABDOMEN AND PELVIS WITHOUT CONTRAST  Technique:  Contiguous axial images were obtained from the base of the skull through the vertex without contrast.,Technique: Multidetector CT imaging of the abdomen and pelvis was performed following the standard protocol without intravenous contrast.  Comparison: None  Findings: No mass effect, midline shift, or acute intracranial hemorrhage.  Mastoid air cells and visualized and nasal sinuses are clear.  No skull fracture.  IMPRESSION: Negative head CT.  *RADIOLOGY REPORT*  Clinical Data:  Right flank pain.  Nausea and vomiting.  CT ABDOMEN AND PELVIS WITHOUT CONTRAST  Technique:  Multidetector CT imaging of the abdomen and pelvis was performed following the standard protocol without intravenous contrast.  Comparison:   None.  Findings:  Mild right hydroureter.  No evidence of right-sided caliectasis.  Stranding about the right kidney is present.  No definite ureteral calculus.  Unremarkable appearance of the left kidney and ureter.  Bladder is mildly distended.  Multiple phleboliths project over the pelvis.  Normal appendix.  Unenhanced liver, gallbladder, spleen, pancreas, adrenal glands are within normal limits.  Circumaortic  left renal vein anatomy.  IMPRESSION: Right sided perinephric stranding is associated with dilatation of the right ureter.  Although no ureteral calculi are present., these findings can be associated with a recently passed right ureteral calculus.  Also consider pyelonephritis.  Normal appendix.  Original Report Authenticated By: Donavan Burnet, M.D.    Dg Chest 2 View 12/19/2011  *RADIOLOGY REPORT*  Clinical Data: Shortness of breath and fever  CHEST - 2 VIEW  Comparison: 07/05/2011  Findings: Cardiomediastinal silhouette is within normal limits. The lungs are clear. No pleural effusion.  No pneumothorax.  No acute osseous abnormality.  IMPRESSION: Normal chest.   Original Report Authenticated By: Harrel Lemon, M.D.    Ct Head Wo Contrast 12/19/2011  *RADIOLOGY REPORT*  Clinical Data: Headache  CT HEAD WITHOUT CONTRAST,CT ABDOMEN AND PELVIS WITHOUT CONTRAST  Technique:  Contiguous axial images were obtained from the base of the skull through the vertex without contrast.,Technique: Multidetector CT imaging of the abdomen and pelvis was performed following the standard protocol without intravenous contrast.  Comparison: None  Findings: No mass effect, midline shift, or acute intracranial hemorrhage.  Mastoid air cells and visualized and nasal sinuses are clear.  No skull fracture.  IMPRESSION: Negative head CT.  *RADIOLOGY REPORT*  Clinical Data:  Right flank pain.  Nausea and vomiting.  CT ABDOMEN AND PELVIS WITHOUT CONTRAST  Technique:  Multidetector CT imaging of the abdomen and pelvis was performed following the standard protocol without intravenous contrast.  Comparison:   None.  Findings:  Mild right hydroureter.  No evidence of right-sided caliectasis.  Stranding about the right kidney is present.  No definite ureteral calculus.  Unremarkable appearance of the left kidney and ureter.  Bladder is mildly distended.  Multiple phleboliths project over the pelvis.  Normal appendix.  Unenhanced liver, gallbladder, spleen, pancreas, adrenal glands are within normal limits.  Circumaortic left renal vein anatomy.  IMPRESSION: Right sided perinephric stranding is associated with dilatation of the right ureter.  Although no ureteral calculi are present., these findings can be associated with a recently passed right ureteral calculus.  Also consider pyelonephritis.  Normal appendix.  Original Report Authenticated By: Donavan Burnet, M.D.    Discharge Laboratory Values: Basic Metabolic Panel:  Lab 12/21/11 1610 12/19/11 1630  NA 137 135  K 3.6 4.7  CL 105 103  CO2 24 22  GLUCOSE 105* 104*  BUN 5* 14  CREATININE 0.61 0.69  CALCIUM 8.2* 8.3*  MG -- --  PHOS -- --    GFR Estimated Creatinine Clearance: 82.3 ml/min (by C-G formula based on Cr of 0.61). Liver Function Tests:  Lab 12/21/11 0412  AST 81*  ALT 89*  ALKPHOS 129*  BILITOT 0.2*  PROT 6.3  ALBUMIN 2.9*    Lab 12/19/11 2007  LIPASE 14  AMYLASE --    CBC:  Lab 12/21/11 0412 12/19/11 1630  WBC 4.4 4.4  NEUTROABS 3.2 4.0  HGB 9.9* 10.8*  HCT 29.5* 32.1*  MCV 96.7 97.3  PLT 176 164   Cardiac Enzymes:  Lab 12/20/11 0745  CKTOTAL 46  CKMB --  CKMBINDEX --  TROPONINI --   Anemia work up  [employer] 12/20/11 1408  VITAMINB12 302  FOLATE 8.7  FERRITIN 598*  TIBC Not calculated due to Iron <10.  IRON <10*  RETICCTPCT 0.8   Microbiology Recent Results (from the past 240 hour(s))  URINE CULTURE     Status: Normal   Collection Time   12/19/11  7:48 PM  Component Value Range Status Comment   Specimen Description URINE, CLEAN CATCH   Final    Special Requests NONE   Final    Culture  Setup Time [id number]   Final    Colony Count >=100,000 COLONIES/ML   Final    Culture ESCHERICHIA COLI   Final    Report Status 12/22/2011 FINAL   Final    Organism ID, Bacteria ESCHERICHIA COLI   Final   CULTURE, BLOOD (ROUTINE X 2)     Status: Normal (Preliminary result)   Collection Time   12/19/11  8:01 PM      Component Value Range Status Comment   Specimen Description BLOOD RIGHT ARM   Final    Special Requests BOTTLES DRAWN AEROBIC AND ANAEROBIC 3CC   Final    Culture  Setup Time [id number]   Final    Culture     Final    Value:        BLOOD CULTURE RECEIVED NO GROWTH TO DATE CULTURE WILL BE HELD FOR 5 DAYS BEFORE ISSUING A FINAL NEGATIVE REPORT   Report Status PENDING   Incomplete   CULTURE, BLOOD (ROUTINE X 2)     Status: Normal (Preliminary result)   Collection Time   12/19/11  8:10 PM      Component Value Range Status Comment   Specimen Description BLOOD RIGHT HAND   Final    Special Requests BOTTLES DRAWN AEROBIC AND ANAEROBIC 5CC   Final    Culture  Setup Time  [id number]   Final    Culture     Final    Value:        BLOOD CULTURE RECEIVED NO GROWTH TO DATE CULTURE WILL BE HELD FOR 5 DAYS BEFORE ISSUING A FINAL NEGATIVE REPORT   Report Status PENDING   Incomplete   MRSA PCR SCREENING     Status: Normal   Collection Time   12/19/11 10:49 PM      Component Value Range Status Comment   MRSA by PCR NEGATIVE  NEGATIVE  Final      Brief H and P: For complete details please refer to admission H and P, but in brief, Kimberly [name] is a 46 year old female with a past medical history of hypertension, hypothyroidism, fibromyalgia, and generalized anxiety disorder as well as a remote history of [place] spotted fever who presented to the hospital on 12/20/2011 with chief complaint of right-sided flank pain, diarrhea, myalgias, and headache. Upon initial presentation, she was hypotensive and required fluid volume resuscitation. She was felt to be septic with the most likely source being pyelonephritis.   Physical Exam at Discharge: BP 164/83  Pulse 85  Temp(Src) 98.7 F (37.1 C) (Oral)  Resp 17  Ht 5' 2.5" (1.588 m)  Wt 73.12 kg (161 lb 3.2 oz)  BMI 29.01 kg/m2  SpO2 92% Gen:  NAD Cardiovascular:  RRR, No M/R/G Respiratory: Lungs CTAB Gastrointestinal: Abdomen soft, NT/ND with normal active bowel sounds. Extremities: No C/E/C  Hospital Course:  Principal Problem:  *Sepsis secondary to pyelonephritis  CT scan of the abdomen and pelvis showed right perinephric stranding consistent with pyelonephritis.  Pro calcitonin elevated but lactic acid within the normal range.  Given a dose of Rocephin with subsequent broadening of antibiotics as noted below, with subsequent narrowing on 12/20/2011.  Preliminary urine cultures grew Escherichia coli.  The patient was continued on Rocephin until urine culture sensitivities were back. She will be discharged on 10 days of therapy with oral Cipro based  on sensitivity data.. Active Problems:  HTN (hypertension)   Hypotensive on admission.  Blood pressure medications held until today, when her blood pressure began to rise to the hypertensive range. Hypothyroidism  Continue Synthroid. Fibromyalgia  Continue Cymbalta.  Can resume oral pain management medications at discharge. Anxiety  Continue Klonopin.  A 30 day supply of Amy and was written for the patient for help with insomnia. Normocytic anemia  Anemia panel shows iron deficiency. Recommend further evaluation as an outpatient and consideration of iron supplementation therapy when nausea improved.  Recommendations for hospital follow-up: 1. Consider GI evaluation for microcytic, iron deficient anemia. 2. Consider a trial of iron therapy once nausea has improved.  Diet:  Low-sodium, heart healthy.  Activity:  Increase activity slowly.  Condition at Discharge:   Improved.  Time spent on Discharge:  25 minutes.  Signed: Dr. Trula Ore Isaid Salvia Pager [phone] 12/22/2011, 8:46 AM  Written instructions reviewed and provided to the patient: Kimberly [name] 12/22/11  Treatment team:   Dr. Hillery Aldo, Hospitalist (Internist)  Medical Issues and plan: Principal Problem:  *Infection causing low blood pressure thought to be due to kidney infection  CT scan of the abdomen and pelvis showed right sided infection of the kidney.  CT scan of the head was negative.  Initially put on broad-spectrum antibiotics, which were changed to Rocephin 12/20/2011.  Final cultures grew Escherichia coli bacteria, sensitive to Cipro.  Discharge on Cipro 2 x a day for 10 more days.  Follow up with Dr. Clarene Duke for any recurrent symptoms, or as needed after discharge. Active Problems:  High blood pressure  Blood pressure was low on admission.  Blood pressure medications will be resumed today.  Under active thyroid gland  Continue Synthroid.  Fibromyalgia  Continue Cymbalta.    Can use Vicodin and Ibuprofen for pain relief upon discharge.   Anxiety  Continue Klonopin.  Ambien as needed for sleep.  Low blood count (anemia)  May be due to acute illness.  Hemoglobin stable.  Anticipated discharge date: Today

## 2011-12-22 NOTE — Progress Notes (Signed)
Pt concerned about upward trend in her blood pressure since admission, she would like her blood pressure med from home restarted. On call service called and made aware pt's headache, and blood pressure, received order for hydralazine one time order. Rounding physician to see later in am.

## 2011-12-22 NOTE — Discharge Instructions (Signed)
Pyelonephritis, Adult Pyelonephritis is a kidney infection. In general, there are 2 main types of pyelonephritis:  Infections that come on quickly without any warning (acute pyelonephritis).   Infections that persist for a long period of time (chronic pyelonephritis).  CAUSES  Two main causes of pyelonephritis are:  Bacteria traveling from the bladder to the kidney. This is a problem especially in pregnant women. The urine in the bladder can become filled with bacteria from multiple causes, including:   Inflammation of the prostate gland (prostatitis).   Sexual intercourse in females.   Bladder infection (cystitis).   Bacteria traveling from the bloodstream to the tissue part of the kidney.  Problems that may increase your risk of getting a kidney infection include:  Diabetes.   Kidney stones or bladder stones.   Cancer.   Catheters placed in the bladder.   Other abnormalities of the kidney or ureter.  SYMPTOMS   Abdominal pain.   Pain in the side or flank area.   Fever.   Chills.   Upset stomach.   Blood in the urine (dark urine).   Frequent urination.   Strong or persistent urge to urinate.   Burning or stinging when urinating.  DIAGNOSIS  Your caregiver may diagnose your kidney infection based on your symptoms. A urine sample may also be taken. TREATMENT  In general, treatment depends on how severe the infection is.   If the infection is mild and caught early, your caregiver may treat you with oral antibiotics and send you home.   If the infection is more severe, the bacteria may have gotten into the bloodstream. This will require intravenous (IV) antibiotics and a hospital stay. Symptoms may include:   High fever.   Severe flank pain.   Shaking chills.   Even after a hospital stay, your caregiver may require you to be on oral antibiotics for a period of time.   Other treatments may be required depending upon the cause of the infection.  HOME CARE  INSTRUCTIONS   Take your antibiotics as directed. Finish them even if you start to feel better.   Make an appointment to have your urine checked to make sure the infection is gone.   Drink enough fluids to keep your urine clear or pale yellow.   Take medicines for the bladder if you have urgency and frequency of urination as directed by your caregiver.  SEEK IMMEDIATE MEDICAL CARE IF:   You have a fever.   You are unable to take your antibiotics or fluids.   You develop shaking chills.   You experience extreme weakness or fainting.   There is no improvement after 2 days of treatment.  MAKE SURE YOU:  Understand these instructions.   Will watch your condition.   Will get help right away if you are not doing well or get worse.  Document Released: 08/12/2005 Document Revised: 08/01/2011 Document Reviewed: 01/16/2011 [employer] Patient Information 2012 [employer]. 

## 2011-12-26 LAB — CULTURE, BLOOD (ROUTINE X 2)
Culture  Setup Time: 201304260129
Culture: NO GROWTH

## 2012-01-08 ENCOUNTER — Emergency Department (HOSPITAL_COMMUNITY)
Admission: EM | Admit: 2012-01-08 | Discharge: 2012-01-08 | Disposition: A | Discharge status: Home / Self Care | Payer: Medicare Other | Attending: Emergency Medicine | Admitting: Emergency Medicine

## 2012-01-08 ENCOUNTER — Encounter (HOSPITAL_COMMUNITY): Payer: Self-pay | Admitting: *Deleted

## 2012-01-08 DIAGNOSIS — G4700 Insomnia, unspecified: Secondary | ICD-10-CM | POA: Insufficient documentation

## 2012-01-08 DIAGNOSIS — F341 Dysthymic disorder: Secondary | ICD-10-CM | POA: Insufficient documentation

## 2012-01-08 DIAGNOSIS — F411 Generalized anxiety disorder: Secondary | ICD-10-CM | POA: Insufficient documentation

## 2012-01-08 DIAGNOSIS — Z79899 Other long term (current) drug therapy: Secondary | ICD-10-CM | POA: Insufficient documentation

## 2012-01-08 DIAGNOSIS — G8929 Other chronic pain: Secondary | ICD-10-CM | POA: Insufficient documentation

## 2012-01-08 DIAGNOSIS — R109 Unspecified abdominal pain: Secondary | ICD-10-CM | POA: Insufficient documentation

## 2012-01-08 DIAGNOSIS — I10 Essential (primary) hypertension: Secondary | ICD-10-CM | POA: Insufficient documentation

## 2012-01-08 LAB — URINALYSIS, ROUTINE W REFLEX MICROSCOPIC
Glucose, UA: NEGATIVE mg/dL
Hgb urine dipstick: NEGATIVE
Leukocytes, UA: NEGATIVE
Protein, ur: NEGATIVE mg/dL
Specific Gravity, Urine: 1.029 (ref 1.005–1.030)
pH: 6 (ref 5.0–8.0)

## 2012-01-08 LAB — RAPID URINE DRUG SCREEN, HOSP PERFORMED
Amphetamines: NOT DETECTED
Benzodiazepines: POSITIVE — AB
Cocaine: NOT DETECTED
Opiates: POSITIVE — AB

## 2012-01-08 LAB — CBC
MCV: 92.5 fL (ref 78.0–100.0)
Platelets: 277 10*3/uL (ref 150–400)
RBC: 4.42 MIL/uL (ref 3.87–5.11)
RDW: 12.2 % (ref 11.5–15.5)
WBC: 6.8 10*3/uL (ref 4.0–10.5)

## 2012-01-08 LAB — BASIC METABOLIC PANEL
CO2: 22 mEq/L (ref 19–32)
Chloride: 101 mEq/L (ref 96–112)
Glucose, Bld: 91 mg/dL (ref 70–99)
Potassium: 3.5 mEq/L (ref 3.5–5.1)
Sodium: 136 mEq/L (ref 135–145)

## 2012-01-08 LAB — DIFFERENTIAL
Basophils Absolute: 0 10*3/uL (ref 0.0–0.1)
Lymphocytes Relative: 39 % (ref 12–46)
Lymphs Abs: 2.6 10*3/uL (ref 0.7–4.0)
Neutro Abs: 3.7 10*3/uL (ref 1.7–7.7)

## 2012-01-08 MED ORDER — ONDANSETRON HCL 4 MG/2ML IJ SOLN
4.0000 mg | Freq: Once | INTRAMUSCULAR | Status: AC
Start: 2012-01-08 — End: 2012-01-08
  Administered 2012-01-08: 4 mg via INTRAVENOUS
  Filled 2012-01-08: qty 2

## 2012-01-08 MED ORDER — OXYCODONE-ACETAMINOPHEN 5-325 MG PO TABS: 2.0000 | ORAL_TABLET | ORAL | Status: AC | PRN

## 2012-01-08 MED ORDER — SODIUM CHLORIDE 0.9 % IV BOLUS (SEPSIS)
500.0000 mL | Freq: Once | INTRAVENOUS | Status: AC
  Administered 2012-01-08: 500 mL via INTRAVENOUS

## 2012-01-08 MED ORDER — FENTANYL CITRATE 0.05 MG/ML IJ SOLN
50.0000 ug | Freq: Once | INTRAMUSCULAR | Status: AC
  Administered 2012-01-08: 50 ug via INTRAVENOUS
  Filled 2012-01-08: qty 2

## 2012-01-08 MED ORDER — LORAZEPAM 2 MG/ML IJ SOLN
0.5000 mg | Freq: Once | INTRAMUSCULAR | Status: AC
  Administered 2012-01-08: 0.5 mg via INTRAVENOUS
  Filled 2012-01-08: qty 1

## 2012-01-08 NOTE — ED Provider Notes (Signed)
Medical screening examination/treatment/procedure(s) were performed by non-physician practitioner and as supervising physician I was immediately available for consultation/collaboration.  Cheri Guppy, MD 01/08/12 2303
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
## 2012-01-08 NOTE — ED Notes (Signed)
Pt was d/c from here 2 weeks ago. Pt finished antitbiotics 2 days ago for kidney infection. Pt voices anxiety at this time
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
## 2012-01-08 NOTE — Discharge Instructions (Signed)
Your blood tests, and urine tests do not show any signs of a kidney infection, kidney stone, or other significant illness.  Use ibuprofen 600 mg every 6 hours for pain.  Use Percocet for more severe pain.  Followup with your Dr. if your symptoms.  Last more than 2-3 days

## 2012-03-11 ENCOUNTER — Emergency Department (HOSPITAL_COMMUNITY)
Admission: EM | Admit: 2012-03-11 | Discharge: 2012-03-11 | Disposition: A | Discharge status: Home / Self Care | Payer: Medicare Other | Attending: Emergency Medicine | Admitting: Emergency Medicine

## 2012-03-11 ENCOUNTER — Encounter (HOSPITAL_COMMUNITY): Payer: Self-pay | Admitting: *Deleted

## 2012-03-11 DIAGNOSIS — I10 Essential (primary) hypertension: Secondary | ICD-10-CM | POA: Insufficient documentation

## 2012-03-11 DIAGNOSIS — F410 Panic disorder [episodic paroxysmal anxiety] without agoraphobia: Secondary | ICD-10-CM | POA: Insufficient documentation

## 2012-03-11 DIAGNOSIS — F419 Anxiety disorder, unspecified: Secondary | ICD-10-CM

## 2012-03-11 LAB — CBC WITH DIFFERENTIAL/PLATELET
Basophils Absolute: 0 10*3/uL (ref 0.0–0.1)
Eosinophils Absolute: 0.1 10*3/uL (ref 0.0–0.7)
Eosinophils Relative: 1 % (ref 0–5)
HCT: 43.5 % (ref 36.0–46.0)
Lymphocytes Relative: 32 % (ref 12–46)
Lymphs Abs: 3 10*3/uL (ref 0.7–4.0)
MCH: 33.1 pg (ref 26.0–34.0)
MCV: 94.2 fL (ref 78.0–100.0)
Monocytes Absolute: 0.7 10*3/uL (ref 0.1–1.0)
RDW: 13.1 % (ref 11.5–15.5)
WBC: 9.5 10*3/uL (ref 4.0–10.5)

## 2012-03-11 LAB — POCT I-STAT, CHEM 8
BUN: 10 mg/dL (ref 6–23)
Calcium, Ion: 1.26 mmol/L — ABNORMAL HIGH (ref 1.12–1.23)
Creatinine, Ser: 0.9 mg/dL (ref 0.50–1.10)
TCO2: 21 mmol/L (ref 0–100)

## 2012-03-11 MED ORDER — CLONAZEPAM 0.5 MG PO TABS
2.0000 mg | ORAL_TABLET | Freq: Once | ORAL | Status: AC
  Administered 2012-03-11: 2 mg via ORAL
  Filled 2012-03-11: qty 1
  Filled 2012-03-11: qty 3

## 2012-03-11 MED ORDER — CLONAZEPAM 1 MG PO TABS: 1.0000 mg | ORAL_TABLET | Freq: Two times a day (BID) | ORAL | Status: DC | PRN

## 2012-03-11 MED ORDER — LORAZEPAM 1 MG PO TABS
1.0000 mg | ORAL_TABLET | Freq: Once | ORAL | Status: AC
  Administered 2012-03-11: 1 mg via ORAL
  Filled 2012-03-11: qty 1

## 2012-03-11 MED ORDER — CLONIDINE HCL 0.1 MG PO TABS
0.1000 mg | ORAL_TABLET | Freq: Once | ORAL | Status: AC
  Administered 2012-03-11: 0.1 mg via ORAL
  Filled 2012-03-11: qty 1

## 2012-03-11 MED ORDER — LORAZEPAM 2 MG/ML IJ SOLN: 1.0000 mg | Freq: Once | INTRAMUSCULAR | Status: DC
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
## 2012-03-11 NOTE — ED Notes (Signed)
Patient reports she is having panic attack and bp problems.  Patient states she has chest tightness that she relates to her panic attack.  She is out of her medications.

## 2012-03-13 NOTE — ED Provider Notes (Signed)
Medical screening examination/treatment/procedure(s) were performed by non-physician practitioner and as supervising physician I was immediately available for consultation/collaboration.   Dione Booze, MD 03/13/12 [phone]

## 2012-08-05 ENCOUNTER — Ambulatory Visit: Payer: Self-pay | Admitting: Pain Medicine

## 2012-08-13 ENCOUNTER — Ambulatory Visit: Payer: Self-pay | Admitting: Pain Medicine

## 2012-08-17 ENCOUNTER — Ambulatory Visit: Payer: Self-pay | Admitting: Pain Medicine

## 2012-09-03 ENCOUNTER — Ambulatory Visit: Payer: Self-pay | Admitting: Pain Medicine

## 2012-10-14 ENCOUNTER — Encounter (HOSPITAL_COMMUNITY): Payer: Self-pay | Admitting: Emergency Medicine

## 2012-10-14 ENCOUNTER — Emergency Department (HOSPITAL_COMMUNITY)
Admission: EM | Admit: 2012-10-14 | Discharge: 2012-10-14 | Disposition: A | Discharge status: Home / Self Care | Payer: Medicare Other | Attending: Emergency Medicine | Admitting: Emergency Medicine

## 2012-10-14 ENCOUNTER — Emergency Department (HOSPITAL_COMMUNITY): Payer: Medicare Other

## 2012-10-14 DIAGNOSIS — R079 Chest pain, unspecified: Secondary | ICD-10-CM | POA: Insufficient documentation

## 2012-10-14 DIAGNOSIS — R413 Other amnesia: Secondary | ICD-10-CM | POA: Insufficient documentation

## 2012-10-14 DIAGNOSIS — Z8739 Personal history of other diseases of the musculoskeletal system and connective tissue: Secondary | ICD-10-CM | POA: Insufficient documentation

## 2012-10-14 DIAGNOSIS — F29 Unspecified psychosis not due to a substance or known physiological condition: Secondary | ICD-10-CM | POA: Insufficient documentation

## 2012-10-14 DIAGNOSIS — R197 Diarrhea, unspecified: Secondary | ICD-10-CM | POA: Insufficient documentation

## 2012-10-14 DIAGNOSIS — J069 Acute upper respiratory infection, unspecified: Secondary | ICD-10-CM

## 2012-10-14 DIAGNOSIS — I10 Essential (primary) hypertension: Secondary | ICD-10-CM | POA: Insufficient documentation

## 2012-10-14 DIAGNOSIS — F3289 Other specified depressive episodes: Secondary | ICD-10-CM | POA: Insufficient documentation

## 2012-10-14 DIAGNOSIS — R059 Cough, unspecified: Secondary | ICD-10-CM | POA: Insufficient documentation

## 2012-10-14 DIAGNOSIS — F329 Major depressive disorder, single episode, unspecified: Secondary | ICD-10-CM | POA: Insufficient documentation

## 2012-10-14 DIAGNOSIS — R110 Nausea: Secondary | ICD-10-CM | POA: Insufficient documentation

## 2012-10-14 DIAGNOSIS — G8929 Other chronic pain: Secondary | ICD-10-CM | POA: Insufficient documentation

## 2012-10-14 DIAGNOSIS — Z8669 Personal history of other diseases of the nervous system and sense organs: Secondary | ICD-10-CM | POA: Insufficient documentation

## 2012-10-14 DIAGNOSIS — F411 Generalized anxiety disorder: Secondary | ICD-10-CM | POA: Insufficient documentation

## 2012-10-14 DIAGNOSIS — R05 Cough: Secondary | ICD-10-CM | POA: Insufficient documentation

## 2012-10-14 DIAGNOSIS — R509 Fever, unspecified: Secondary | ICD-10-CM | POA: Insufficient documentation

## 2012-10-14 DIAGNOSIS — Z79899 Other long term (current) drug therapy: Secondary | ICD-10-CM | POA: Insufficient documentation

## 2012-10-14 DIAGNOSIS — IMO0001 Reserved for inherently not codable concepts without codable children: Secondary | ICD-10-CM | POA: Insufficient documentation

## 2012-10-14 DIAGNOSIS — Z8619 Personal history of other infectious and parasitic diseases: Secondary | ICD-10-CM | POA: Insufficient documentation

## 2012-10-14 DIAGNOSIS — M549 Dorsalgia, unspecified: Secondary | ICD-10-CM | POA: Insufficient documentation

## 2012-10-14 LAB — CBC WITH DIFFERENTIAL/PLATELET
Basophils Relative: 0 % (ref 0–1)
Eosinophils Absolute: 0 10*3/uL (ref 0.0–0.7)
Eosinophils Relative: 0 % (ref 0–5)
MCH: 30.8 pg (ref 26.0–34.0)
MCHC: 34.2 g/dL (ref 30.0–36.0)
MCV: 90.1 fL (ref 78.0–100.0)
Monocytes Relative: 11 % (ref 3–12)
Neutrophils Relative %: 68 % (ref 43–77)
Platelets: 137 10*3/uL — ABNORMAL LOW (ref 150–400)

## 2012-10-14 LAB — POCT I-STAT, CHEM 8
Creatinine, Ser: 0.8 mg/dL (ref 0.50–1.10)
Glucose, Bld: 103 mg/dL — ABNORMAL HIGH (ref 70–99)
HCT: 37 % (ref 36.0–46.0)
Hemoglobin: 12.6 g/dL (ref 12.0–15.0)
Potassium: 3.9 mEq/L (ref 3.5–5.1)
Sodium: 142 mEq/L (ref 135–145)
TCO2: 23 mmol/L (ref 0–100)

## 2012-10-14 MED ORDER — AMITRIPTYLINE HCL 100 MG PO TABS: 100.0000 mg | ORAL_TABLET | Freq: Every day | ORAL | Status: DC

## 2012-10-14 MED ORDER — OXYCODONE-ACETAMINOPHEN 5-325 MG PO TABS
1.0000 | ORAL_TABLET | Freq: Once | ORAL | Status: AC
  Administered 2012-10-14: 1 via ORAL
  Filled 2012-10-14: qty 1

## 2012-10-14 MED ORDER — FLUCONAZOLE 150 MG PO TABS: 150.0000 mg | ORAL_TABLET | Freq: Once | ORAL | Status: AC

## 2012-10-14 MED ORDER — SODIUM CHLORIDE 0.9 % IV BOLUS (SEPSIS)
1000.0000 mL | Freq: Once | INTRAVENOUS | Status: AC
  Administered 2012-10-14: 1000 mL via INTRAVENOUS

## 2012-10-14 MED ORDER — MORPHINE SULFATE 4 MG/ML IJ SOLN
4.0000 mg | Freq: Once | INTRAMUSCULAR | Status: AC
  Administered 2012-10-14: 4 mg via INTRAVENOUS
  Filled 2012-10-14: qty 1

## 2012-10-14 MED ORDER — HYDROCODONE-ACETAMINOPHEN 10-325 MG PO TABS
1.0000 | ORAL_TABLET | ORAL | Status: DC | PRN
Start: 2012-10-14 — End: 2013-01-08

## 2012-10-14 NOTE — ED Notes (Addendum)
Pt brought to ED via [facility].  Pt was dx with left lower lobe pneumonia earlier this week, pt given Amoxicillin.  Pt presents with cough, body aches, and fatigue - taking antibiotics as prescribed.
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
## 2012-10-28 ENCOUNTER — Ambulatory Visit: Payer: Self-pay | Admitting: Pain Medicine

## 2012-10-29 ENCOUNTER — Ambulatory Visit: Payer: Self-pay | Admitting: Pain Medicine

## 2012-12-03 ENCOUNTER — Ambulatory Visit: Payer: Self-pay | Admitting: Pain Medicine

## 2013-01-08 ENCOUNTER — Emergency Department (HOSPITAL_COMMUNITY)
Admission: EM | Admit: 2013-01-08 | Discharge: 2013-01-08 | Disposition: A | Discharge status: Home / Self Care | Payer: Medicare Other | Attending: Emergency Medicine | Admitting: Emergency Medicine

## 2013-01-08 ENCOUNTER — Encounter (HOSPITAL_COMMUNITY): Payer: Self-pay | Admitting: *Deleted

## 2013-01-08 ENCOUNTER — Emergency Department (HOSPITAL_COMMUNITY): Payer: Medicare Other

## 2013-01-08 ENCOUNTER — Other Ambulatory Visit: Payer: Self-pay

## 2013-01-08 DIAGNOSIS — Z8669 Personal history of other diseases of the nervous system and sense organs: Secondary | ICD-10-CM | POA: Insufficient documentation

## 2013-01-08 DIAGNOSIS — F419 Anxiety disorder, unspecified: Secondary | ICD-10-CM

## 2013-01-08 DIAGNOSIS — M549 Dorsalgia, unspecified: Secondary | ICD-10-CM | POA: Insufficient documentation

## 2013-01-08 DIAGNOSIS — F411 Generalized anxiety disorder: Secondary | ICD-10-CM | POA: Insufficient documentation

## 2013-01-08 DIAGNOSIS — F329 Major depressive disorder, single episode, unspecified: Secondary | ICD-10-CM | POA: Insufficient documentation

## 2013-01-08 DIAGNOSIS — Z79899 Other long term (current) drug therapy: Secondary | ICD-10-CM | POA: Insufficient documentation

## 2013-01-08 DIAGNOSIS — F3289 Other specified depressive episodes: Secondary | ICD-10-CM | POA: Insufficient documentation

## 2013-01-08 DIAGNOSIS — I10 Essential (primary) hypertension: Secondary | ICD-10-CM | POA: Insufficient documentation

## 2013-01-08 DIAGNOSIS — R0602 Shortness of breath: Secondary | ICD-10-CM | POA: Insufficient documentation

## 2013-01-08 DIAGNOSIS — IMO0001 Reserved for inherently not codable concepts without codable children: Secondary | ICD-10-CM | POA: Insufficient documentation

## 2013-01-08 DIAGNOSIS — R0789 Other chest pain: Secondary | ICD-10-CM | POA: Insufficient documentation

## 2013-01-08 DIAGNOSIS — R413 Other amnesia: Secondary | ICD-10-CM | POA: Insufficient documentation

## 2013-01-08 DIAGNOSIS — Z8619 Personal history of other infectious and parasitic diseases: Secondary | ICD-10-CM | POA: Insufficient documentation

## 2013-01-08 DIAGNOSIS — G479 Sleep disorder, unspecified: Secondary | ICD-10-CM | POA: Insufficient documentation

## 2013-01-08 DIAGNOSIS — G8929 Other chronic pain: Secondary | ICD-10-CM | POA: Insufficient documentation

## 2013-01-08 LAB — URINALYSIS, ROUTINE W REFLEX MICROSCOPIC
Bilirubin Urine: NEGATIVE
Ketones, ur: NEGATIVE mg/dL
Leukocytes, UA: NEGATIVE
Nitrite: NEGATIVE
Protein, ur: NEGATIVE mg/dL
Urobilinogen, UA: 0.2 mg/dL (ref 0.0–1.0)

## 2013-01-08 LAB — BASIC METABOLIC PANEL
Calcium: 10.4 mg/dL (ref 8.4–10.5)
GFR calc Af Amer: >90 mL/min (ref >90)
GFR calc non Af Amer: >90 mL/min (ref >90)
Glucose, Bld: 127 mg/dL — ABNORMAL HIGH (ref 70–99)
Potassium: 3.3 mEq/L — ABNORMAL LOW (ref 3.5–5.1)
Sodium: 138 mEq/L (ref 135–145)

## 2013-01-08 LAB — CBC
Hemoglobin: 14.4 g/dL (ref 12.0–15.0)
MCH: 31.9 pg (ref 26.0–34.0)
MCHC: 36.9 g/dL — ABNORMAL HIGH (ref 30.0–36.0)
Platelets: 217 10*3/uL (ref 150–400)

## 2013-01-08 LAB — POCT I-STAT TROPONIN I: Troponin i, poc: 0 ng/mL (ref 0.00–0.08)

## 2013-01-08 LAB — URINE MICROSCOPIC-ADD ON

## 2013-01-08 MED ORDER — DIAZEPAM 5 MG/ML IJ SOLN
5.0000 mg | Freq: Once | INTRAMUSCULAR | Status: AC
  Administered 2013-01-08: 5 mg via INTRAVENOUS
  Filled 2013-01-08: qty 2

## 2013-01-08 MED ORDER — AMITRIPTYLINE HCL 100 MG PO TABS: 100.0000 mg | ORAL_TABLET | Freq: Every day | ORAL | Status: DC

## 2013-01-08 MED ORDER — DIAZEPAM 5 MG/ML IJ SOLN
5.0000 mg | Freq: Once | INTRAMUSCULAR | Status: AC
Start: 2013-01-08 — End: 2013-01-08
  Administered 2013-01-08: 5 mg via INTRAVENOUS
  Filled 2013-01-08: qty 2

## 2013-01-08 MED ORDER — PERCOCET 5-325 MG PO TABS: 1.0000 | ORAL_TABLET | Freq: Four times a day (QID) | ORAL | Status: DC | PRN

## 2013-01-08 MED ORDER — MORPHINE SULFATE 4 MG/ML IJ SOLN
6.0000 mg | Freq: Once | INTRAMUSCULAR | Status: AC
  Administered 2013-01-08: 6 mg via INTRAVENOUS
  Filled 2013-01-08: qty 2

## 2013-01-08 NOTE — ED Provider Notes (Signed)
Medical screening examination/treatment/procedure(s) were performed by non-physician practitioner and as supervising physician I was immediately available for consultation/collaboration.  Ireanna Finlayson M Shaquavia Whisonant, MD 01/08/13 0637 

## 2013-01-08 NOTE — ED Notes (Addendum)
C/o central cp (non-reproducible) for a few hours. Dr. Andrey Cota, "prime canidate for heart attack." hx. Of anxiety attacks. Pt. Not been taking Vicodin for few days for chronic back pain. Takes 6  10/325 mg Vicodin per day. Pt. Off klonopin. Unable to sleep.
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
## 2013-01-10 LAB — URINE CULTURE: Colony Count: >=100000

## 2013-01-11 NOTE — Progress Notes (Signed)
ED Antimicrobial Stewardship Positive Culture Follow Up   Kimberly [name] is an 47 y.o. female who presented to [facility] on 01/08/2013 with a chief complaint of chronic pain Chief Complaint  Patient presents with  . Chest Pain  . Shortness of Breath  . Back Pain    Recent Results (from the past 720 hour(s))  URINE CULTURE     Status: None   Collection Time    01/08/13  3:44 AM      Result Value Range Status   Specimen Description URINE, CLEAN CATCH   Final   Special Requests NONE   Final   Culture  Setup Time 01/08/2013 10:06   Final   Colony Count >=100,000 COLONIES/ML   Final   Culture ESCHERICHIA COLI   Final   Report Status 01/10/2013 FINAL   Final   Organism ID, Bacteria ESCHERICHIA COLI   Final    []  Treated with , organism resistant to prescribed antimicrobial [x]  Patient discharged originally without antimicrobial agent and treatment is now indicated. She has >100k e.coli with wbc in UA. Will treat as uncomplicated UTI  New antibiotic prescription: Septra DS 1 tab bid x 3days  ED Provider: Ivar Drape, PA   Kimberly [name] [place] 01/11/2013, 12:20 PM Infectious Diseases Pharmacist Phone# [phone]

## 2013-01-11 NOTE — ED Notes (Signed)
Post ED Visit - Positive Culture Follow-up: Successful Patient Follow-Up  Culture assessed and recommendations reviewed by: []  Wes Dulaney, Pharm.D., BCPS []  Celedonio Miyamoto, Pharm.D., BCPS []  Georgina Pillion, Pharm.D., BCPS [x]  [place], [address].D., BCPS, AAHIVP []  Estella Husk, Pharm.D., BCPS, AAHIVP  Positive urine culture  [x]  Patient discharged without antimicrobial prescription and treatment is now indicated []  Organism is resistant to prescribed ED discharge antimicrobial []  Patient with positive blood cultures  Chart reviewed by Roxy Horseman with orders written for Septra DS 1 po BID x 3 days Attempt made to contact patient-no answer   Larena Sox 01/11/2013, 5:38 PM

## 2013-01-12 ENCOUNTER — Telehealth (HOSPITAL_COMMUNITY): Payer: Self-pay | Admitting: Emergency Medicine

## 2013-01-13 ENCOUNTER — Ambulatory Visit: Payer: Self-pay | Admitting: Pain Medicine

## 2013-02-05 ENCOUNTER — Ambulatory Visit: Payer: Self-pay | Admitting: Pain Medicine

## 2013-02-09 ENCOUNTER — Telehealth (HOSPITAL_COMMUNITY): Payer: Self-pay | Admitting: Emergency Medicine

## 2013-02-09 NOTE — ED Notes (Signed)
Patient wants rx called to [facility] [phone]-rx called by [facility].

## 2013-02-23 ENCOUNTER — Ambulatory Visit: Payer: Self-pay | Admitting: Pain Medicine

## 2013-03-10 ENCOUNTER — Ambulatory Visit: Payer: Self-pay | Admitting: Pain Medicine

## 2013-04-26 ENCOUNTER — Emergency Department (INDEPENDENT_AMBULATORY_CARE_PROVIDER_SITE_OTHER)
Admission: EM | Admit: 2013-04-26 | Discharge: 2013-04-26 | Disposition: A | Discharge status: Home / Self Care | Payer: Medicare Other | Source: Home / Self Care | Attending: Emergency Medicine | Admitting: Emergency Medicine

## 2013-04-26 ENCOUNTER — Encounter (HOSPITAL_COMMUNITY): Payer: Self-pay | Admitting: Emergency Medicine

## 2013-04-26 DIAGNOSIS — IMO0001 Reserved for inherently not codable concepts without codable children: Secondary | ICD-10-CM

## 2013-04-26 DIAGNOSIS — M797 Fibromyalgia: Secondary | ICD-10-CM

## 2013-04-26 DIAGNOSIS — IMO0002 Reserved for concepts with insufficient information to code with codable children: Secondary | ICD-10-CM

## 2013-04-26 MED ORDER — HYDROCODONE-ACETAMINOPHEN 5-325 MG PO TABS: ORAL_TABLET | ORAL | Status: DC

## 2013-04-26 MED ORDER — CLONAZEPAM 2 MG PO TABS: 2.0000 mg | ORAL_TABLET | Freq: Two times a day (BID) | ORAL | Status: DC | PRN

## 2013-04-26 MED ORDER — AMITRIPTYLINE HCL 100 MG PO TABS: ORAL_TABLET | ORAL | Status: DC

## 2013-04-26 NOTE — ED Notes (Signed)
Needing refill on medication. Hydrocodone, clonopin, and amytriptilene  Pt voices no concerns at this time.
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
## 2013-05-12 ENCOUNTER — Ambulatory Visit (INDEPENDENT_AMBULATORY_CARE_PROVIDER_SITE_OTHER): Payer: Medicare Other | Admitting: Family Medicine

## 2013-05-12 VITALS — BP 120/88 | HR 94 | Temp 99.2°F | Resp 20 | Ht 64.0 in | Wt 184.0 lb

## 2013-05-12 DIAGNOSIS — F411 Generalized anxiety disorder: Secondary | ICD-10-CM

## 2013-05-12 DIAGNOSIS — M797 Fibromyalgia: Secondary | ICD-10-CM

## 2013-05-12 DIAGNOSIS — IMO0001 Reserved for inherently not codable concepts without codable children: Secondary | ICD-10-CM

## 2013-05-12 DIAGNOSIS — M5136 Other intervertebral disc degeneration, lumbar region: Secondary | ICD-10-CM

## 2013-05-12 DIAGNOSIS — M5137 Other intervertebral disc degeneration, lumbosacral region: Secondary | ICD-10-CM

## 2013-05-12 DIAGNOSIS — M51379 Other intervertebral disc degeneration, lumbosacral region without mention of lumbar back pain or lower extremity pain: Secondary | ICD-10-CM

## 2013-05-12 DIAGNOSIS — F419 Anxiety disorder, unspecified: Secondary | ICD-10-CM

## 2013-05-12 MED ORDER — AMITRIPTYLINE HCL 100 MG PO TABS: 100.0000 mg | ORAL_TABLET | Freq: Every day | ORAL | Status: DC

## 2013-05-12 MED ORDER — CLONAZEPAM 1 MG PO TABS: 1.0000 mg | ORAL_TABLET | Freq: Four times a day (QID) | ORAL | Status: DC

## 2013-05-12 MED ORDER — HYDROCODONE-ACETAMINOPHEN 10-325 MG PO TABS: 1.0000 | ORAL_TABLET | Freq: Four times a day (QID) | ORAL | Status: DC | PRN
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
## 2013-09-02 ENCOUNTER — Ambulatory Visit: Payer: Self-pay | Admitting: Specialist

## 2013-09-03 ENCOUNTER — Other Ambulatory Visit: Payer: Self-pay | Admitting: Internal Medicine

## 2013-09-03 ENCOUNTER — Encounter: Payer: Self-pay | Admitting: Internal Medicine

## 2013-09-03 ENCOUNTER — Ambulatory Visit: Payer: Medicare Other | Attending: Internal Medicine | Admitting: Internal Medicine

## 2013-09-03 VITALS — BP 139/77 | HR 90 | Temp 98.9°F | Resp 14 | Ht 62.0 in | Wt 191.2 lb

## 2013-09-03 DIAGNOSIS — M549 Dorsalgia, unspecified: Secondary | ICD-10-CM

## 2013-09-03 DIAGNOSIS — G894 Chronic pain syndrome: Secondary | ICD-10-CM | POA: Insufficient documentation

## 2013-09-03 DIAGNOSIS — E119 Type 2 diabetes mellitus without complications: Secondary | ICD-10-CM

## 2013-09-03 DIAGNOSIS — E039 Hypothyroidism, unspecified: Secondary | ICD-10-CM

## 2013-09-03 MED ORDER — LEVOTHYROXINE SODIUM 100 MCG PO TABS: 100.0000 ug | ORAL_TABLET | Freq: Every day | ORAL | Status: DC

## 2013-09-03 MED ORDER — ZOLPIDEM TARTRATE 5 MG PO TABS: 5.0000 mg | ORAL_TABLET | Freq: Every evening | ORAL | Status: DC | PRN

## 2013-09-03 MED ORDER — BISOPROLOL-HYDROCHLOROTHIAZIDE 5-6.25 MG PO TABS: 1.0000 | ORAL_TABLET | Freq: Every day | ORAL | Status: DC
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
## 2013-09-03 NOTE — Progress Notes (Signed)
Pt is here to establish care. Complains of a history of back pain and problems from neck until the bottom of back since 1998. Also complains of Lt leg going numb. Not taking OTC medication except Aleve. Having trouble walking long time frames due to pain. Pain scale at a level 7 today. Had an MRI completed January 2014. Requests a referral and pain management.

## 2013-09-06 ENCOUNTER — Telehealth: Payer: Self-pay | Admitting: Internal Medicine

## 2013-09-06 NOTE — Telephone Encounter (Signed)
Pt called because she did not really understand what was said, please contact pt

## 2013-09-07 ENCOUNTER — Ambulatory Visit: Payer: Medicare Other | Attending: Internal Medicine

## 2013-09-07 DIAGNOSIS — E119 Type 2 diabetes mellitus without complications: Secondary | ICD-10-CM

## 2013-09-07 LAB — POCT GLYCOSYLATED HEMOGLOBIN (HGB A1C): Hemoglobin A1C: 5.1

## 2013-09-07 MED ORDER — LEVOTHYROXINE SODIUM 100 MCG PO TABS: 100.0000 ug | ORAL_TABLET | Freq: Every day | ORAL | Status: DC

## 2013-09-07 MED ORDER — ZOLPIDEM TARTRATE 5 MG PO TABS: 5.0000 mg | ORAL_TABLET | Freq: Every evening | ORAL | Status: DC | PRN

## 2013-09-07 MED ORDER — BISOPROLOL-HYDROCHLOROTHIAZIDE 5-6.25 MG PO TABS: 1.0000 | ORAL_TABLET | Freq: Every day | ORAL | Status: DC

## 2013-09-09 LAB — DRUGS OF ABUSE SCREEN W/O ALC, ROUTINE URINE
AMPHETAMINE SCRN UR: NEGATIVE
Barbiturate Quant, Ur: POSITIVE — AB
Benzodiazepines.: POSITIVE — AB
COCAINE METABOLITES: NEGATIVE
CREATININE, U: 64.5 mg/dL
Marijuana Metabolite: NEGATIVE
Methadone: NEGATIVE
OPIATE SCREEN, URINE: NEGATIVE
PHENCYCLIDINE (PCP): NEGATIVE
Propoxyphene: NEGATIVE

## 2013-09-10 LAB — BARBITURATES (GC/LC/MS), URINE
AMOBARBITAL GC/MS, URINE: NEGATIVE ng/mL
Butalbital: 487 ng/mL — ABNORMAL HIGH
PENTABARBITAL GC/MS, URINE: NEGATIVE ng/mL
PHENOBARBITAL GC/MS, URINE: NEGATIVE ng/mL
SECOBARBITAL GC/MS, URINE: NEGATIVE ng/mL

## 2013-09-13 LAB — BENZODIAZEPINES (GC/LC/MS), URINE
ALPRAZOLAMU: NEGATIVE ng/mL
Alprazolam (GC/LC/MS), ur confirm: NEGATIVE ng/mL
Clonazepam metabolite (GC/LC/MS), ur confirm: 214 ng/mL
DIAZEPAMUC: NEGATIVE ng/mL
ESTAZOLAMU: NEGATIVE ng/mL
FLURAZEPAMU: NEGATIVE ng/mL
Flunitrazepam metabolite (GC/LC/MS), ur confirm: NEGATIVE ng/mL
Lorazepam (GC/LC/MS), ur confirm: NEGATIVE ng/mL
MIDAZOLAMU: NEGATIVE ng/mL
Nordiazepam (GC/LC/MS), ur confirm: NEGATIVE ng/mL
Oxazepam (GC/LC/MS), ur confirm: NEGATIVE ng/mL
Temazepam (GC/LC/MS), ur confirm: NEGATIVE ng/mL
Triazolam metabolite (GC/LC/MS), ur confirm: NEGATIVE ng/mL

## 2013-10-28 IMAGING — CT CT HEAD W/O CM
1 of 4 series · 14 of 30 positions shown, 18 images · non-contrast
Comparison: None
COMPARISON: None.

CLINICAL DATA: Headache

CT HEAD WITHOUT CONTRAST,CT ABDOMEN AND PELVIS WITHOUT CONTRAST
TECHNIQUE: Contiguous axial images were obtained from the base of
the skull through the vertex without contrast.,Technique:
Multidetector CT imaging of the abdomen and pelvis was performed
following the standard protocol without intravenous contrast.
CLINICAL DATA: Right flank pain.  Nausea and vomiting.
CT ABDOMEN AND PELVIS WITHOUT CONTRAST
TECHNIQUE: Multidetector CT imaging of the abdomen and pelvis was
performed following the standard protocol without intravenous
contrast.

[Series 5: abd/pel w/o · axial · non-contrast · 0.71mm/px · z∈[-852,-446]mm · 14 of 95 slices shown, 18 images]
[im 7/95  brain]
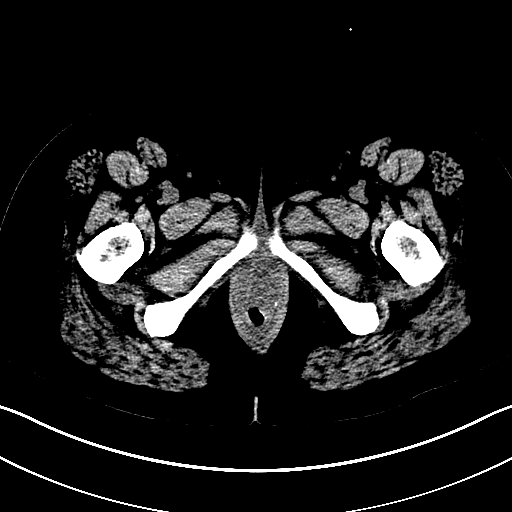
[im 7/95  bone]
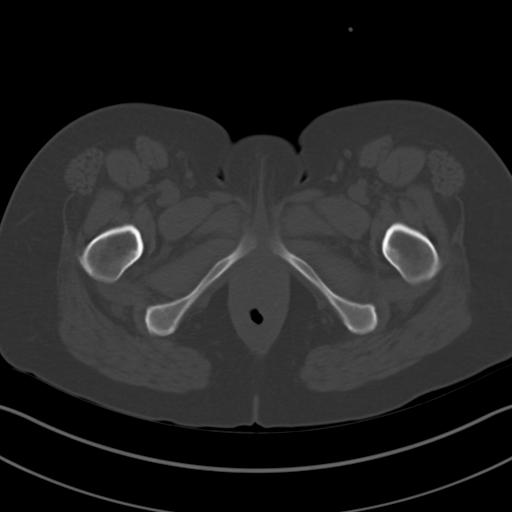
[im 13/95  brain]
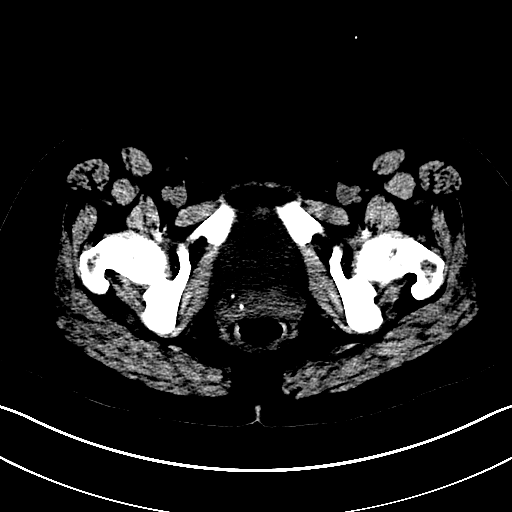
[im 19/95  brain]
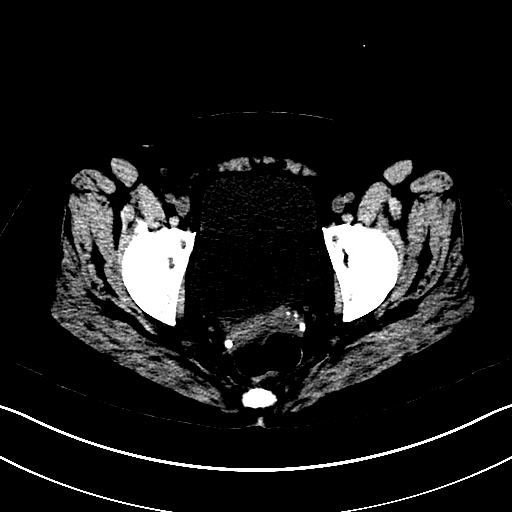
[im 26/95  brain]
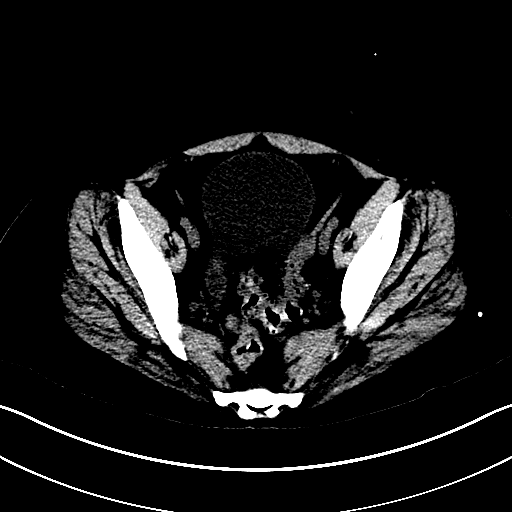
[im 32/95  brain]
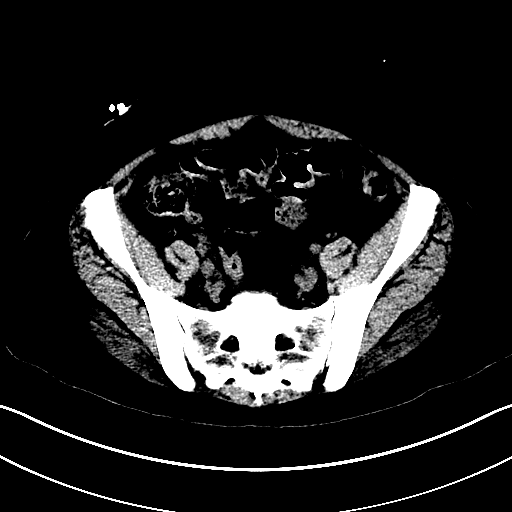
[im 32/95  bone]
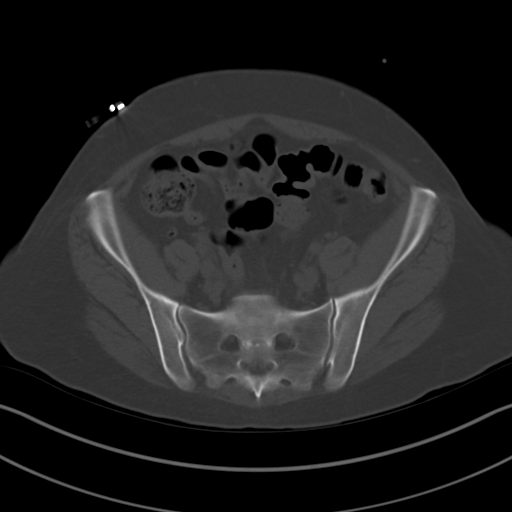
[im 38/95  brain]
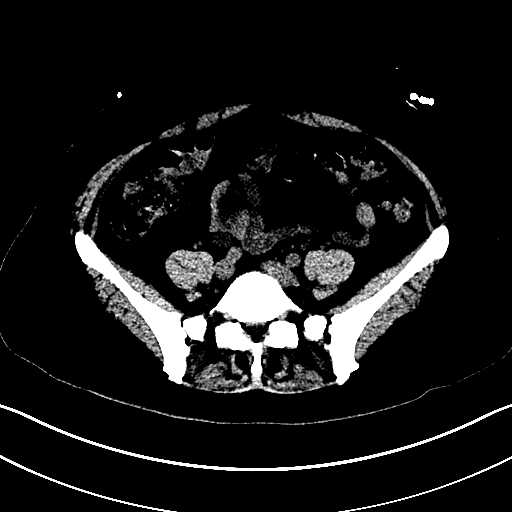
[im 44/95  brain]
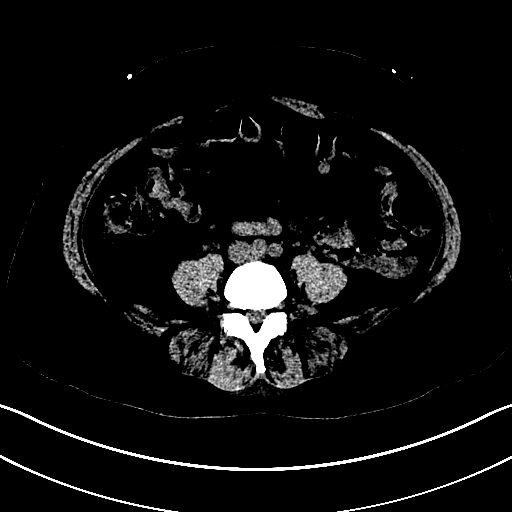
[im 51/95  brain]
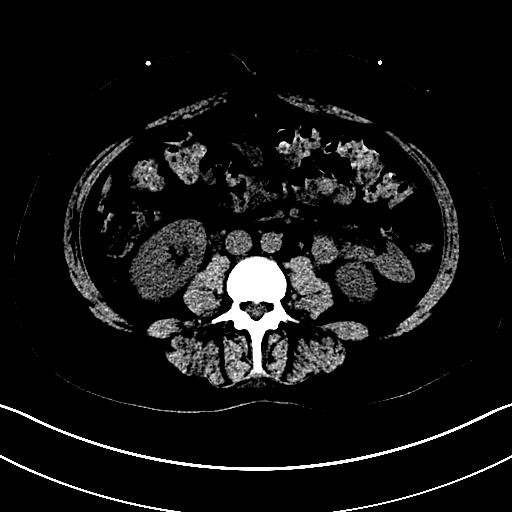
[im 57/95  brain]
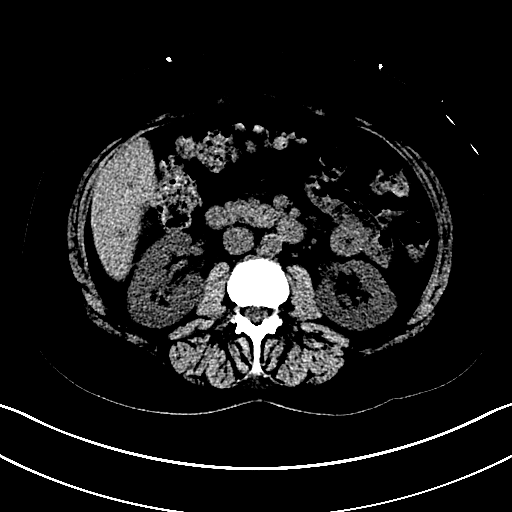
[im 57/95  bone]
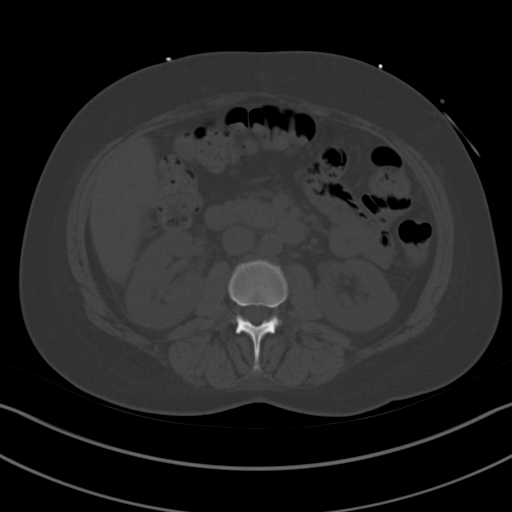
[im 63/95  brain]
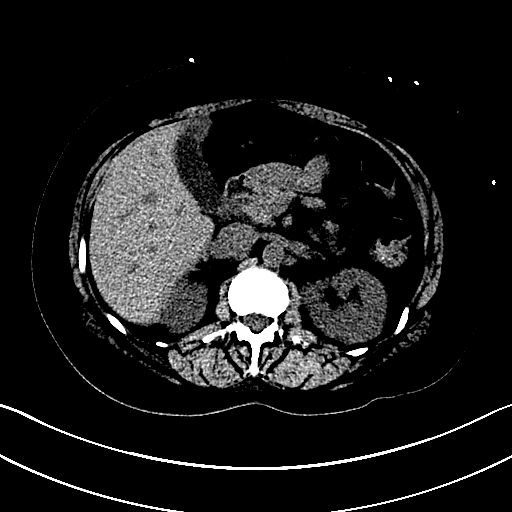
[im 69/95  brain]
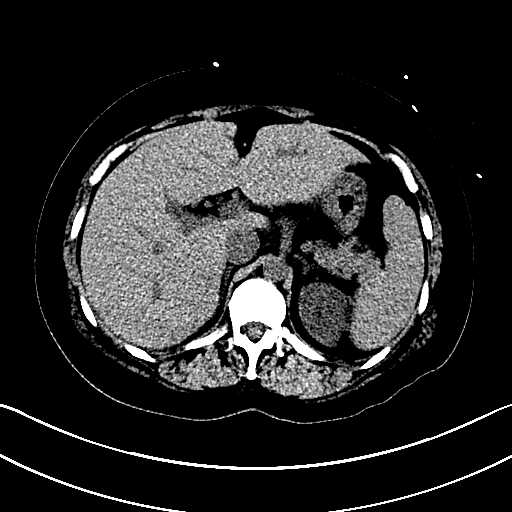
[im 76/95  brain]
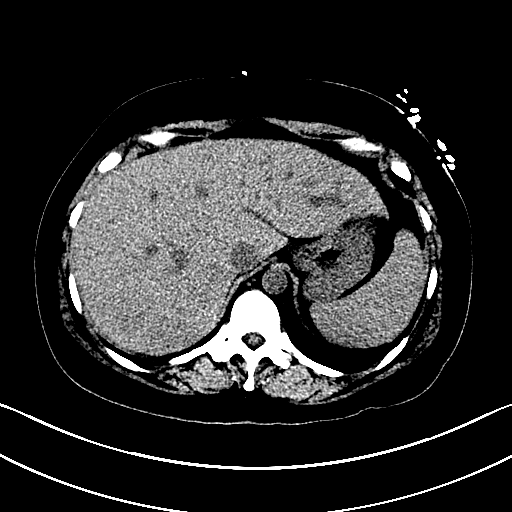
[im 82/95  brain]
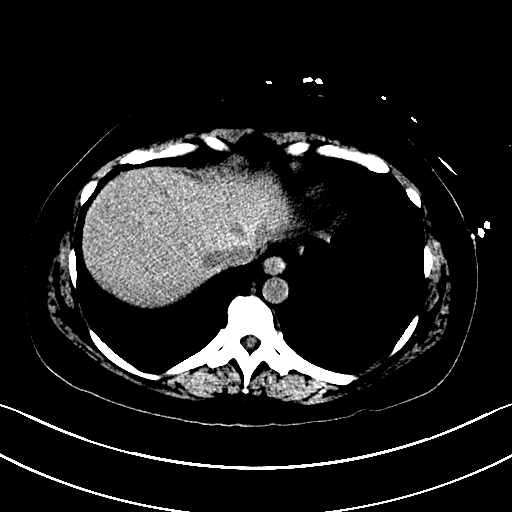
[im 82/95  bone]
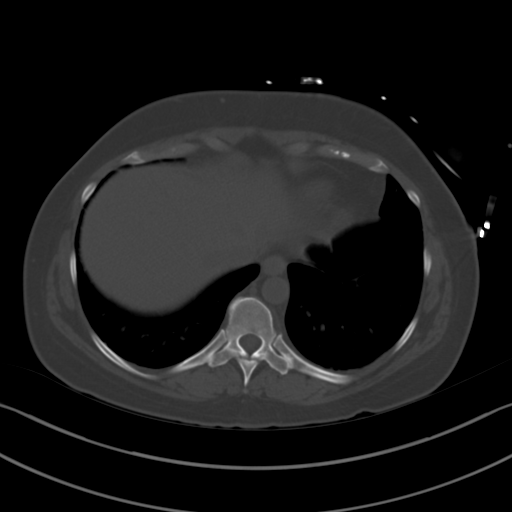
[im 88/95  brain]
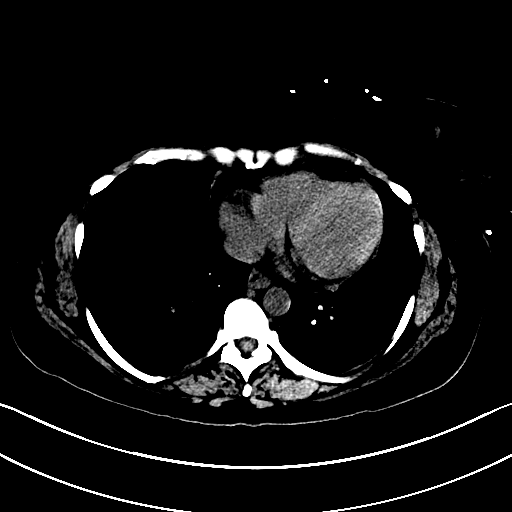

[14 of 30 positions shown; findings below may reference images not displayed]

FINDINGS: No mass effect, midline shift, or acute intracranial
hemorrhage.  Mastoid air cells and visualized and nasal sinuses are
clear.  No skull fracture.
IMPRESSION: Negative head CT.
FINDINGS: Mild right hydroureter.  No evidence of right-sided
caliectasis.  Stranding about the right kidney is present.  No
definite ureteral calculus.  Unremarkable appearance of the left
kidney and ureter.

Bladder is mildly distended.  Multiple phleboliths project over the
pelvis.

Normal appendix.  Unenhanced liver, gallbladder, spleen, pancreas,
adrenal glands are within normal limits.  Circumaortic left renal
vein anatomy.
IMPRESSION: Right sided perinephric stranding is associated with dilatation of
the right ureter.  Although no ureteral calculi are present., these
findings can be associated with a recently passed right ureteral
calculus.  Also consider pyelonephritis.

Normal appendix.

## 2013-10-28 IMAGING — CR DG CHEST 2V
2 series · 2 of 2 positions shown · non-contrast
Comparison: 07/05/2011

CLINICAL DATA: Shortness of breath and fever

CHEST - 2 VIEW

[w chest lat (1 of 2)]
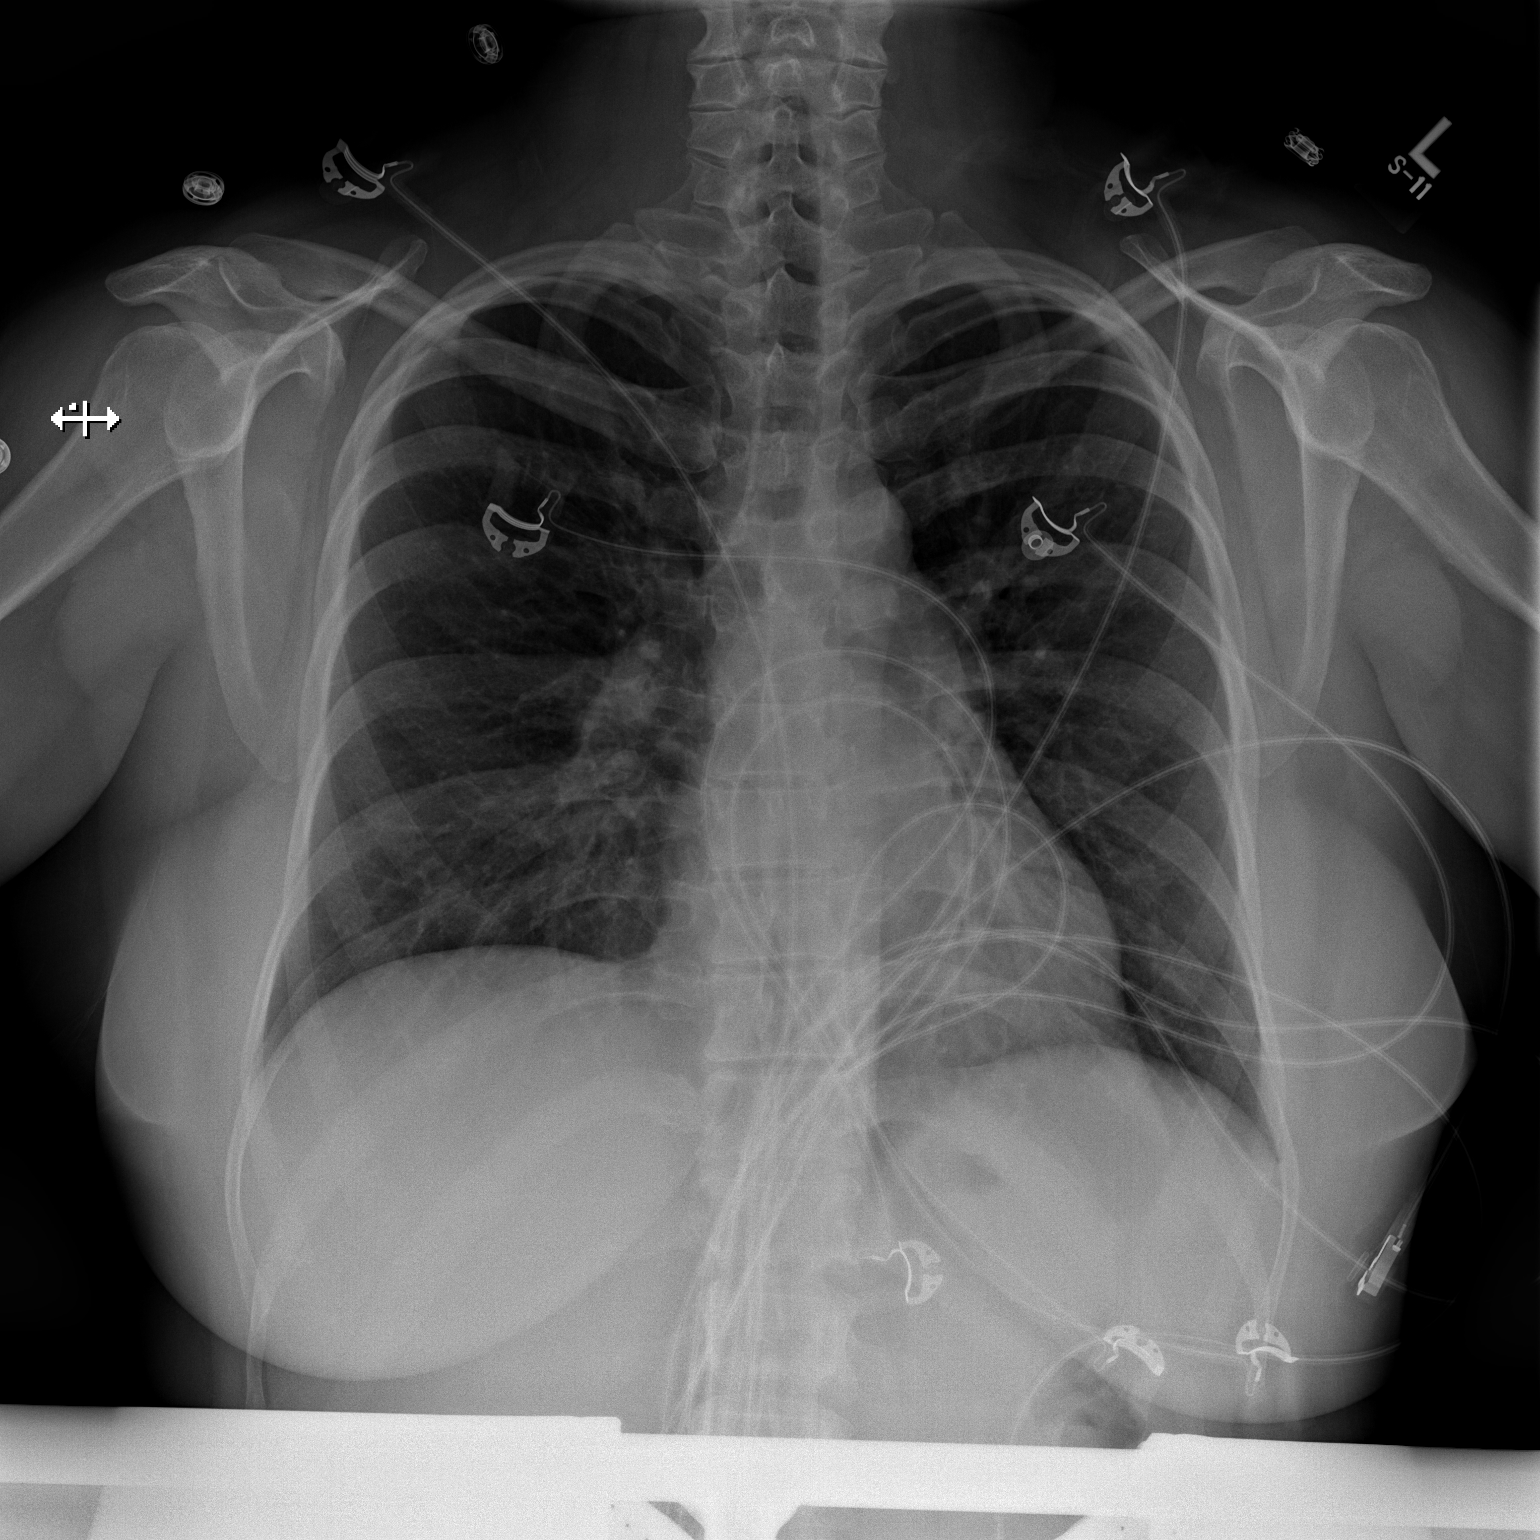

[w chest lat (2 of 2)]
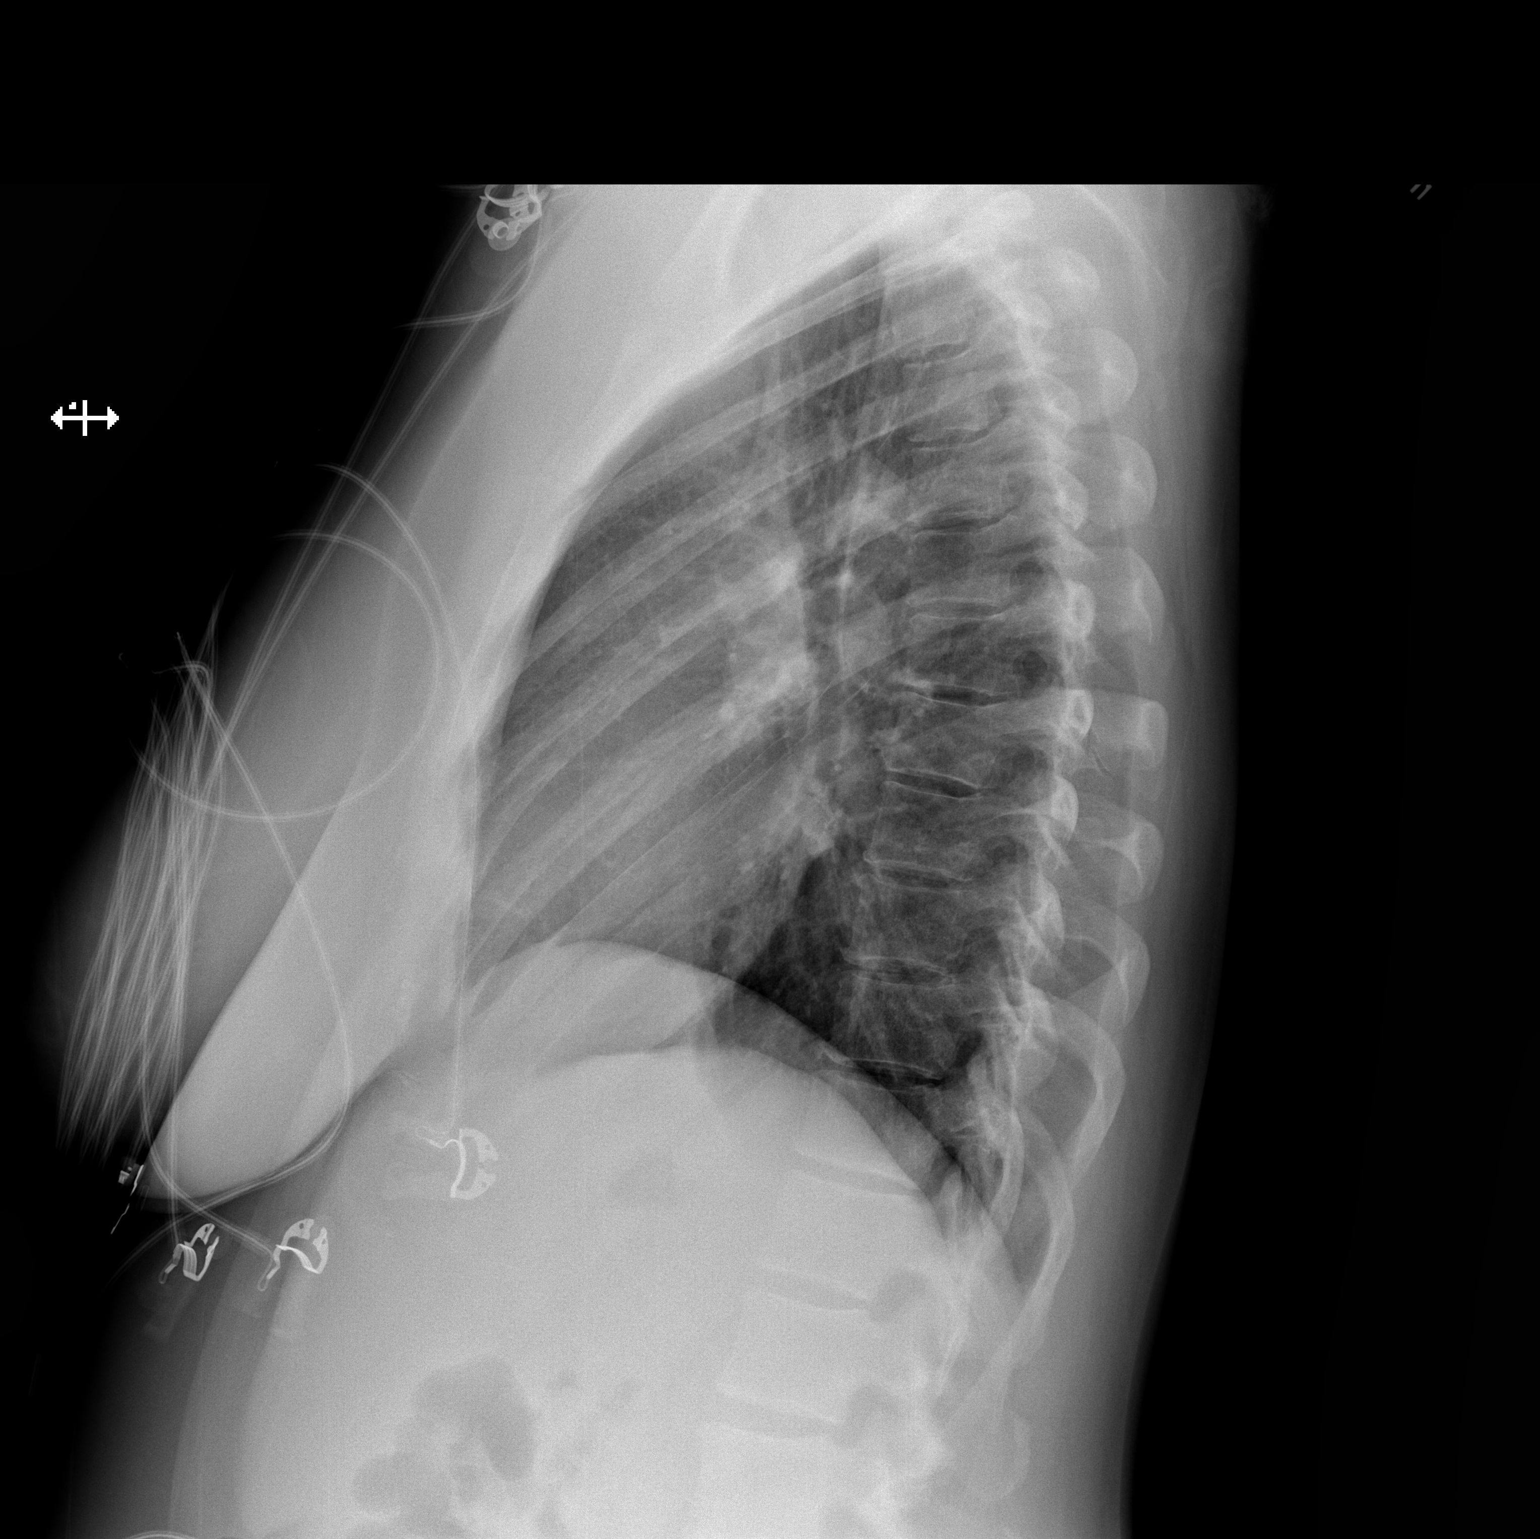

[2 of 2 positions shown; findings below may reference images not displayed]

FINDINGS: Cardiomediastinal silhouette is within normal limits. The
lungs are clear. No pleural effusion.  No pneumothorax.  No acute
osseous abnormality.
IMPRESSION: Normal chest.

## 2013-11-19 ENCOUNTER — Encounter (HOSPITAL_COMMUNITY): Payer: Self-pay | Admitting: Emergency Medicine

## 2013-11-19 ENCOUNTER — Emergency Department (HOSPITAL_COMMUNITY)
Admission: EM | Admit: 2013-11-19 | Discharge: 2013-11-19 | Disposition: A | Discharge status: Home / Self Care | Payer: Medicare Other | Attending: Emergency Medicine | Admitting: Emergency Medicine

## 2013-11-19 DIAGNOSIS — Z8619 Personal history of other infectious and parasitic diseases: Secondary | ICD-10-CM | POA: Insufficient documentation

## 2013-11-19 DIAGNOSIS — IMO0001 Reserved for inherently not codable concepts without codable children: Secondary | ICD-10-CM | POA: Insufficient documentation

## 2013-11-19 DIAGNOSIS — M545 Low back pain, unspecified: Secondary | ICD-10-CM | POA: Insufficient documentation

## 2013-11-19 DIAGNOSIS — F411 Generalized anxiety disorder: Secondary | ICD-10-CM | POA: Insufficient documentation

## 2013-11-19 DIAGNOSIS — R269 Unspecified abnormalities of gait and mobility: Secondary | ICD-10-CM | POA: Insufficient documentation

## 2013-11-19 DIAGNOSIS — I10 Essential (primary) hypertension: Secondary | ICD-10-CM | POA: Insufficient documentation

## 2013-11-19 DIAGNOSIS — Z79899 Other long term (current) drug therapy: Secondary | ICD-10-CM | POA: Insufficient documentation

## 2013-11-19 DIAGNOSIS — R413 Other amnesia: Secondary | ICD-10-CM | POA: Insufficient documentation

## 2013-11-19 DIAGNOSIS — F29 Unspecified psychosis not due to a substance or known physiological condition: Secondary | ICD-10-CM | POA: Insufficient documentation

## 2013-11-19 DIAGNOSIS — G8929 Other chronic pain: Secondary | ICD-10-CM | POA: Insufficient documentation

## 2013-11-19 DIAGNOSIS — M542 Cervicalgia: Secondary | ICD-10-CM | POA: Insufficient documentation

## 2013-11-19 DIAGNOSIS — R209 Unspecified disturbances of skin sensation: Secondary | ICD-10-CM | POA: Insufficient documentation

## 2013-11-19 DIAGNOSIS — Z888 Allergy status to other drugs, medicaments and biological substances status: Secondary | ICD-10-CM | POA: Insufficient documentation

## 2013-11-19 MED ORDER — OXYCODONE-ACETAMINOPHEN 5-325 MG PO TABS
2.0000 | ORAL_TABLET | Freq: Once | ORAL | Status: AC
  Administered 2013-11-19: 2 via ORAL
  Filled 2013-11-19: qty 2

## 2013-11-19 NOTE — ED Notes (Signed)
Per EMS: Pt with c/o chronic back that increased since last night; states she is out pain rx. No recent injury. NAD; pt ambulatory. Denies urinary/bowl incontinence. Neuro intact.

## 2013-11-19 NOTE — Discharge Instructions (Signed)
1. Medications: usual home medications 2. Treatment: rest, drink plenty of fluids,  3. Follow Up: You have been given a referral to neurosurgery at your request, please follow-up for further evaluation after today's visit; please also follow-up at the PCP that you reported seeing on a regular basis in the past; THIS EMERGENCY ROOM WILL BE UNABLE TO WRITE NARCOTIC PAIN MEDICATIONS FOR YOU FOR CHRONIC BACK PAIN.  Chronic Back Pain  When back pain lasts longer than 3 months, it is called chronic back pain.People with chronic back pain often go through certain periods that are more intense (flare-ups).  CAUSES Chronic back pain can be caused by wear and tear (degeneration) on different structures in your back. These structures include:  The bones of your spine (vertebrae) and the joints surrounding your spinal cord and nerve roots (facets).  The strong, fibrous tissues that connect your vertebrae (ligaments). Degeneration of these structures may result in pressure on your nerves. This can lead to constant pain. HOME CARE INSTRUCTIONS  Avoid bending, heavy lifting, prolonged sitting, and activities which make the problem worse.  Take brief periods of rest throughout the day to reduce your pain. Lying down or standing usually is better than sitting while you are resting.  Take over-the-counter or prescription medicines only as directed by your caregiver. SEEK IMMEDIATE MEDICAL CARE IF:   You have weakness or numbness in one of your legs or feet.  You have trouble controlling your bladder or bowels.  You have nausea, vomiting, abdominal pain, shortness of breath, or fainting. Document Released: 09/19/2004 Document Revised: 11/04/2011 Document Reviewed: 07/27/2011 [facility] Patient Information 2014 [employer], [place].   Emergency Department Resource Guide 1) Find a Doctor and Pay Out of Pocket Although you won't have to find out who is covered by your insurance plan, it is a good idea to ask  around and get recommendations. You will then need to call the office and see if the doctor you have chosen will accept you as a new patient and what types of options they offer for patients who are self-pay. Some doctors offer discounts or will set up payment plans for their patients who do not have insurance, but you will need to ask so you aren't surprised when you get to your appointment.  2) Contact Your Local Health Department Not all health departments have doctors that can see patients for sick visits, but many do, so it is worth a call to see if yours does. If you don't know where your local health department is, you can check in your phone book. The CDC also has a tool to help you locate your state's health department, and many state websites also have listings of all of their local health departments.  3) Find a Walk-in Clinic If your illness is not likely to be very severe or complicated, you may want to try a walk in clinic. These are popping up all over the country in pharmacies, drugstores, and shopping centers. They're usually staffed by nurse practitioners or physician assistants that have been trained to treat common illnesses and complaints. They're usually fairly quick and inexpensive. However, if you have serious medical issues or chronic medical problems, these are probably not your best option.  No Primary Care Doctor: - Call Health Connect at  [phone] - they can help you locate a primary care doctor that  accepts your insurance, provides certain services, etc. - Physician Referral Service- [phone]  Chronic Pain Problems: Organization  Address  Phone   Notes  [facility]  [phone] Patients need to be referred by their primary care doctor.   Medication Assistance: Organization         Address  Phone   Notes  [facility] Medication [facility] [address] [place], [place] [address] [phone] --Must be a  resident of [facility] -- Must have NO insurance coverage whatsoever (no Medicaid/ Medicare, etc.) -- The pt. MUST have a primary care doctor that directs their care regularly and follows them in the community   [facility]  [phone]   [employer]  [phone]    Agencies that provide inexpensive medical care: Organization         Address  Phone   Notes  [facility]  [phone]   [facility]    [phone]   [facility] [address] [place], [place] [address] [phone]   [facility] [address] [place] [place] [phone]   Planned Parenthood    [phone]   [facility]    [phone]   [facility]  [address], [place] Phone:  [phone], Fax:  [phone] Hours of Operation:  9 am - 6 pm, M-F.  Also accepts Medicaid/Medicare and self-pay.  [facility]  [address], [place] Phone: [phone], Fax: [phone]. Hours of Operation:  8:30 am - 5:30 pm, M-F.  Also accepts Medicaid and self-pay.  [facility] [place] [address] [place] Phone: [phone]   [facility] [address] Natasha Bence [place], [place] [phone], Ext. [phone] Mondays & Thursdays: 7-9 AM.  First 15 patients are seen on a first come, first serve basis.    Medicaid-accepting [facility] Providers:  Organization         Address  Phone   Notes  [facility] [address], [place] [phone] Also accepts self-pay patients.  [facility] [address] Laurell Josephs [place], [place]  [phone]   [facility] [address], [place] [phone]   [facility] [address], [place] [phone]   Renaye Rakers [address], [place]   [phone] Only accepts  [place] Access [place] patients after they have their name applied to their card.   Self-Pay (no insurance) in [facility]:  Organization         Address  Phone   Notes  Sickle Cell Patients, [facility] [address] [place], [place] [phone]   [facility] [address] [place], [place] [phone]   [facility] [place]  [address], [place] [phone]   [facility]/Dr. Osei-Bonsu  [address] [place] or [address], [place] [phone] Phone number for both [place] and [place] locations is the same.  [facility] [address] [place] [phone]   [facility] [address] [place] or [address] [phone]   [facility] [address] [place] [phone], phone; [phone], fax Sees patients 1st and 3rd Saturday of every month.  Must not qualify  for public or private insurance (i.e. Medicaid, Medicare, [place] Health Choice, Veterans' Benefits)  Household income should be no more than 200% of the poverty level The clinic cannot treat you if you are pregnant or think you are pregnant  Sexually transmitted diseases are not treated at the clinic.    Dental Care: Organization         Address  Phone  Notes  [facility] [address], [place] [phone] Accepts children up to age 50 who are enrolled in [place] or [place] Health Choice; pregnant women with a Medicaid card; and children who have applied for Medicaid or [place] Health Choice, but were declined, whose parents can pay a reduced fee at time of service.  [facility] [place]  [address], [place] [phone] Accepts children up to age 50 who are enrolled in [place] or [place] Health Choice; pregnant women  with a Medicaid card; and children who have applied for Medicaid or [place] Health Choice, but were declined, whose parents can pay a reduced fee at time of service.  [facility]  [address], [place] [phone] Patients are seen by appointment only. Walk-ins are not accepted. [facility] will see patients 50 years of age and older. Monday - Tuesday (8am-5pm) Most Wednesdays (8:30-5pm) $30 per visit, cash only  [facility]  [address] [place] [address], [facility] [place] [phone] Patients are seen by appointment only. Walk-ins are not accepted. [facility] will see patients 50 years of age and older. One Wednesday Evening (Monthly: Volunteer Based).  $30 per visit, cash only  [employer] [facility]  [phone]) [phone] for adults; Children under age 584, call Graduate Pediatric Dentistry at [phone]. Children aged 54-14, please call [phone]) [phone] to request a pediatric application.  Dental services are provided in all areas of dental care including fillings, crowns and bridges, complete and partial dentures, implants, gum treatment, root canals, and extractions. Preventive care is also provided. Treatment is provided to both adults and children. Patients are selected via a lottery and there is often a waiting list.   [facility] [address], [place]  [phone] [email or web address]   [facility] [address], [place], [place] [phone], Ext. [phone] Second and Fourth Thursday of each month, opens at 6:30 AM; Clinic ends at 9 AM.  Patients are seen on a first-come first-served basis, and a limited number are seen during each clinic.   [facility]  [address], [place], [place] [phone]   Eligibility Requirements You must have lived in [place], [place], or [place] counties for at least the last three months.   You cannot be eligible for state or federal sponsored [employer]care  insurance, including CIGNAVeterans Administration, [place]icaid, or [employer]Medicare.   You generally cannot be eligible for healthcare insurance through your employer.    How to apply: Eligibility screenings are held every Tuesday and Wednesday afternoon from 1:00 pm until 4:00 pm. You do not need an appointment for the interview!  [facility] [address], [place], [place] [phone]   [facility]  [phone]   [facility]  [phone]   [facility]  [phone]    Behavioral Health Resources in the Community: Intensive Outpatient Programs Organization         Address  Phone  Notes  [facility] [address] [place], [place] [phone]   [facility] Outpatient [address] [place], [place] [phone]   [facility] [address], [place], [place]  [phone]   [facility] Mental Health [address],  [place], [place] [phone] or [phone]   Substance Abuse Resources Organization         Address  Phone  Notes  [facility] Drug Services  [phone]   [facility]  6042508391   [facility] [place]  [phone]   Floydene Flock  [phone]   Residential & Outpatient Substance Abuse Program  [phone]   Psychological Services Organization         Address  Phone  Notes  [facility]  [phone]   [facility] [place] [facility]  [phone]   [facility] Mental Health [address], [place] [phone] or [phone]    Mobile Crisis Teams Organization         Address  Phone  Notes  [facility], Mobile Crisis Care Unit  [phone]   [facility]  [address] [place], [place] [phone]   Doristine Locks [address] [place] [phone]    Self-Help/Support Groups Organization         Address  Phone             Notes  [facility]  [facility] [place] - variety of support groups  [phone]- I7437963 Call for more information  Narcotics Anonymous (NA), [facility] [address], [employer] [place]  2 meetings at this location   Statistician         Address  Phone  Notes  [facility] [address],    [place]  [phone]   [facility]  [address], [place] [address], [place], [place] [phone]   [facility] [address] [place], [place] [phone] Admissions: 8am-3pm M-F  [facility] [address]    [place], [place] [phone]   [facility] [address] [place], [place], [place] [phone]   [facility] [address]  [place], [place] [phone]   [facility] - Intensive Outpatient [address] Laurell Josephs [address], [place], [place] [phone]   [facility] ([facility] [address]  [place], [place] [phone] or [phone]   [facility] ([facility]) [address], [place], [place] [phone] Accepts Medicaid  [facility] [address],  [place] [phone] Substance Abuse/Addiction Treatment   [facility] Organization         Address  Phone  Notes  [facility]  6104343009   Angie Fava, PhD [address] Ervin Knack [place], [place]   [phone] or [phone]   [facility]   [address] [place], [place] [phone]   [facility] [address] [place], [place] [phone] Insurance/Medicaid/sponsorship through [facility] [address]                                    [place], [place] [phone] Therapy/tele-psych/case  [facility] [address], [place] [phone]    Dr. Lolly Mustache  [phone]   [facility] [place]  [facility] 1) [address],  [place] 2) [address], [place] 3)  [address], [place] [phone]  [phone]   [facility] Child Abuse Hotline (708)615-4705 or [phone] (After Hours)
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
## 2013-11-21 NOTE — ED Provider Notes (Signed)
Medical screening examination/treatment/procedure(s) were performed by non-physician practitioner and as supervising physician I was immediately available for consultation/collaboration.   Gavin PoundMichael Y. Oletta LamasGhim, MD 11/21/13 [phone]

## 2013-12-01 ENCOUNTER — Other Ambulatory Visit: Payer: Self-pay | Admitting: Neurosurgery

## 2013-12-01 DIAGNOSIS — M5136 Other intervertebral disc degeneration, lumbar region: Secondary | ICD-10-CM

## 2013-12-01 DIAGNOSIS — M5412 Radiculopathy, cervical region: Secondary | ICD-10-CM

## 2013-12-02 ENCOUNTER — Ambulatory Visit: Payer: Medicare Other | Admitting: Internal Medicine

## 2013-12-11 ENCOUNTER — Emergency Department (HOSPITAL_COMMUNITY): Payer: Medicare Other

## 2013-12-11 ENCOUNTER — Encounter (HOSPITAL_COMMUNITY): Payer: Self-pay | Admitting: Emergency Medicine

## 2013-12-11 ENCOUNTER — Emergency Department (INDEPENDENT_AMBULATORY_CARE_PROVIDER_SITE_OTHER)
Admission: EM | Admit: 2013-12-11 | Discharge: 2013-12-11 | Disposition: A | Discharge status: Home / Self Care | Payer: Medicare Other | Source: Home / Self Care | Attending: Family Medicine | Admitting: Family Medicine

## 2013-12-11 ENCOUNTER — Emergency Department (HOSPITAL_COMMUNITY)
Admission: EM | Admit: 2013-12-11 | Discharge: 2013-12-11 | Discharge status: Against Medical Advice | Payer: Medicare Other | Attending: Emergency Medicine | Admitting: Emergency Medicine

## 2013-12-11 DIAGNOSIS — R05 Cough: Secondary | ICD-10-CM | POA: Insufficient documentation

## 2013-12-11 DIAGNOSIS — J45901 Unspecified asthma with (acute) exacerbation: Secondary | ICD-10-CM

## 2013-12-11 DIAGNOSIS — I10 Essential (primary) hypertension: Secondary | ICD-10-CM | POA: Insufficient documentation

## 2013-12-11 DIAGNOSIS — G8929 Other chronic pain: Secondary | ICD-10-CM | POA: Insufficient documentation

## 2013-12-11 DIAGNOSIS — R059 Cough, unspecified: Secondary | ICD-10-CM | POA: Insufficient documentation

## 2013-12-11 DIAGNOSIS — J189 Pneumonia, unspecified organism: Secondary | ICD-10-CM

## 2013-12-11 DIAGNOSIS — R51 Headache: Secondary | ICD-10-CM | POA: Insufficient documentation

## 2013-12-11 MED ORDER — FLUCONAZOLE 150 MG PO TABS: 150.0000 mg | ORAL_TABLET | Freq: Once | ORAL | Status: DC

## 2013-12-11 MED ORDER — IPRATROPIUM-ALBUTEROL 0.5-2.5 (3) MG/3ML IN SOLN
RESPIRATORY_TRACT | Status: AC
  Filled 2013-12-11: qty 3

## 2013-12-11 MED ORDER — ALBUTEROL SULFATE HFA 108 (90 BASE) MCG/ACT IN AERS: 2.0000 | INHALATION_SPRAY | Freq: Four times a day (QID) | RESPIRATORY_TRACT | Status: AC | PRN

## 2013-12-11 MED ORDER — LEVOFLOXACIN 500 MG PO TABS: 500.0000 mg | ORAL_TABLET | Freq: Every day | ORAL | Status: DC

## 2013-12-11 MED ORDER — IPRATROPIUM-ALBUTEROL 0.5-2.5 (3) MG/3ML IN SOLN
3.0000 mL | Freq: Once | RESPIRATORY_TRACT | Status: AC
  Administered 2013-12-11: 3 mL via RESPIRATORY_TRACT

## 2013-12-11 MED ORDER — PREDNISONE 10 MG PO TABS: 30.0000 mg | ORAL_TABLET | Freq: Every day | ORAL | Status: DC

## 2013-12-11 NOTE — ED Notes (Signed)
Pt c/o cough onset 3 days ago. Pt presents with dry, barking cough. Pt also c/o headache. Pt tried hydrocodone without relief.

## 2013-12-11 NOTE — ED Notes (Signed)
Pt called to room x 1 no response

## 2013-12-11 NOTE — ED Notes (Signed)
Pt called to room x 2 no response 

## 2013-12-11 NOTE — ED Notes (Signed)
Pt called to room x 3 no response

## 2013-12-11 NOTE — ED Notes (Signed)
C/o  Fever. Headache,  Body aches.  Nonproductive cough.  Wheezing sob.  No relief with otc meds.  Symptoms present since Wednesday.  Denies diarrhea.

## 2013-12-11 NOTE — Discharge Instructions (Signed)
Thank you for coming in today. Please take Levaquin daily for 7 days. Use prednisone daily for 7 days. Use albuterol as needed Call or go to the emergency room if you get worse, have trouble breathing, have chest pains, or palpitations.  Please followup with your primary care doctor. you will need repeat chest x-ray in 4-6 weeks   Asthma, Acute Bronchospasm Acute bronchospasm caused by asthma is also referred to as an asthma attack. Bronchospasm means your air passages become narrowed. The narrowing is caused by inflammation and tightening of the muscles in the air tubes (bronchi) in your lungs. This can make it hard to breath or cause you to wheeze and cough. CAUSES Possible triggers are:  Animal dander from the skin, hair, or feathers of animals.  Dust mites contained in house dust.  Cockroaches.  Pollen from trees or grass.  Mold.  Cigarette or tobacco smoke.  Air pollutants such as dust, household cleaners, hair sprays, aerosol sprays, paint fumes, strong chemicals, or strong odors.  Cold air or weather changes. Cold air may trigger inflammation. Winds increase molds and pollens in the air.  Strong emotions such as crying or laughing hard.  Stress.  Certain medicines such as aspirin or beta-blockers.  Sulfites in foods and drinks, such as dried fruits and wine.  Infections or inflammatory conditions, such as a flu, cold, or inflammation of the nasal membranes (rhinitis).  Gastroesophageal reflux disease (GERD). GERD is a condition where stomach acid backs up into your throat (esophagus).  Exercise or strenuous activity. SIGNS AND SYMPTOMS   Wheezing.  Excessive coughing, particularly at night.  Chest tightness.  Shortness of breath. DIAGNOSIS  Your health care provider will ask you about your medical history and perform a physical exam. A chest X-ray or blood testing may be performed to look for other causes of your symptoms or other conditions that may have  triggered your asthma attack. TREATMENT  Treatment is aimed at reducing inflammation and opening up the airways in your lungs. Most asthma attacks are treated with inhaled medicines. These include quick relief or rescue medicines (such as bronchodilators) and controller medicines (such as inhaled corticosteroids). These medicines are sometimes given through an inhaler or a nebulizer. Systemic steroid medicine taken by mouth or given through an IV tube also can be used to reduce the inflammation when an attack is moderate or severe. Antibiotic medicines are only used if a bacterial infection is present.  HOME CARE INSTRUCTIONS   Rest.  Drink plenty of liquids. This helps the mucus to remain thin and be easily coughed up. Only use caffeine in moderation and do not use alcohol until you have recovered from your illness.  Do not smoke. Avoid being exposed to secondhand smoke.  You play a critical role in keeping yourself in good health. Avoid exposure to things that cause you to wheeze or to have breathing problems.  Keep your medicines up to date and available. Carefully follow your health care provider's treatment plan.  Take your medicine exactly as prescribed.  When pollen or pollution is bad, keep windows closed and use an air conditioner or go to places with air conditioning.  Asthma requires careful medical care. See your health care provider for a follow-up as advised. If you are more than [redacted] weeks pregnant and you were prescribed any new medicines, let your obstetrician know about the visit and how you are doing. Follow-up with your health care provider as directed.  After you have recovered from your asthma  attack, make an appointment with your outpatient doctor to talk about ways to reduce the likelihood of future attacks. If you do not have a doctor who manages your asthma, make an appointment with a primary care doctor to discuss your asthma. SEEK IMMEDIATE MEDICAL CARE IF:   You  are getting worse.  You have trouble breathing. If severe, call your local emergency services (911 in the [place]  You develop chest pain or discomfort.  You are vomiting.  You are not able to keep fluids down.  You are coughing up yellow, green, brown, or bloody sputum.  You have a fever and your symptoms suddenly get worse.  You have trouble swallowing. MAKE SURE YOU:   Understand these instructions.  Will watch your condition.  Will get help right away if you are not doing well or get worse. Document Released: 11/27/2006 Document Revised: 04/14/2013 Document Reviewed: 02/17/2013 [facility] Patient Information 2014 [place], [place]. Pneumonia, Adult Pneumonia is an infection of the lungs.  CAUSES Pneumonia may be caused by bacteria or a virus. Usually, these infections are caused by breathing infectious particles into the lungs (respiratory tract). SYMPTOMS   Cough.  Fever.  Chest pain.  Increased rate of breathing.  Wheezing.  Mucus production. DIAGNOSIS  If you have the common symptoms of pneumonia, your caregiver will typically confirm the diagnosis with a chest X-ray. The X-ray will show an abnormality in the lung (pulmonary infiltrate) if you have pneumonia. Other tests of your blood, urine, or sputum may be done to find the specific cause of your pneumonia. Your caregiver may also do tests (blood gases or pulse oximetry) to see how well your lungs are working. TREATMENT  Some forms of pneumonia may be spread to other people when you cough or sneeze. You may be asked to wear a mask before and during your exam. Pneumonia that is caused by bacteria is treated with antibiotic medicine. Pneumonia that is caused by the influenza virus may be treated with an antiviral medicine. Most other viral infections must run their course. These infections will not respond to antibiotics.  PREVENTION A pneumococcal shot (vaccine) is available to prevent a common bacterial cause of  pneumonia. This is usually suggested for:  People over 32106 years old.  Patients on chemotherapy.  People with chronic lung problems, such as bronchitis or emphysema.  People with immune system problems. If you are over 65 or have a high risk condition, you may receive the pneumococcal vaccine if you have not received it before. In some countries, a routine influenza vaccine is also recommended. This vaccine can help prevent some cases of pneumonia.You may be offered the influenza vaccine as part of your care. If you smoke, it is time to quit. You may receive instructions on how to stop smoking. Your caregiver can provide medicines and counseling to help you quit. HOME CARE INSTRUCTIONS   Cough suppressants may be used if you are losing too much rest. However, coughing protects you by clearing your lungs. You should avoid using cough suppressants if you can.  Your caregiver may have prescribed medicine if he or she thinks your pneumonia is caused by a bacteria or influenza. Finish your medicine even if you start to feel better.  Your caregiver may also prescribe an expectorant. This loosens the mucus to be coughed up.  Only take over-the-counter or prescription medicines for pain, discomfort, or fever as directed by your caregiver.  Do not smoke. Smoking is a common cause of bronchitis and can  contribute to pneumonia. If you are a smoker and continue to smoke, your cough may last several weeks after your pneumonia has cleared.  A cold steam vaporizer or humidifier in your room or home may help loosen mucus.  Coughing is often worse at night. Sleeping in a semi-upright position in a recliner or using a couple pillows under your head will help with this.  Get rest as you feel it is needed. Your body will usually let you know when you need to rest. SEEK IMMEDIATE MEDICAL CARE IF:   Your illness becomes worse. This is especially true if you are elderly or weakened from any other  disease.  You cannot control your cough with suppressants and are losing sleep.  You begin coughing up blood.  You develop pain which is getting worse or is uncontrolled with medicines.  You have a fever.  Any of the symptoms which initially brought you in for treatment are getting worse rather than better.  You develop shortness of breath or chest pain. MAKE SURE YOU:   Understand these instructions.  Will watch your condition.  Will get help right away if you are not doing well or get worse. Document Released: 08/12/2005 Document Revised: 11/04/2011 Document Reviewed: 11/01/2010 [facility] Patient Information 2014 [place], [place].
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
## 2013-12-17 ENCOUNTER — Ambulatory Visit
Admission: RE | Admit: 2013-12-17 | Discharge: 2013-12-17 | Disposition: A | Discharge status: Home / Self Care | Payer: Medicare Other | Source: Ambulatory Visit | Attending: Neurosurgery | Admitting: Neurosurgery

## 2013-12-17 VITALS — BP 101/78 | HR 77

## 2013-12-17 DIAGNOSIS — M5136 Other intervertebral disc degeneration, lumbar region: Secondary | ICD-10-CM

## 2013-12-17 DIAGNOSIS — M5412 Radiculopathy, cervical region: Secondary | ICD-10-CM

## 2013-12-17 DIAGNOSIS — M51369 Other intervertebral disc degeneration, lumbar region without mention of lumbar back pain or lower extremity pain: Secondary | ICD-10-CM

## 2013-12-17 MED ORDER — IOHEXOL 300 MG/ML  SOLN
10.0000 mL | Freq: Once | INTRAMUSCULAR | Status: AC | PRN
  Administered 2013-12-17: 10 mL via INTRATHECAL

## 2013-12-17 MED ORDER — MEPERIDINE HCL 100 MG/ML IJ SOLN
100.0000 mg | Freq: Once | INTRAMUSCULAR | Status: AC
  Administered 2013-12-17: 100 mg via INTRAMUSCULAR

## 2013-12-17 MED ORDER — DIAZEPAM 5 MG PO TABS
10.0000 mg | ORAL_TABLET | Freq: Once | ORAL | Status: AC
  Administered 2013-12-17: 10 mg via ORAL

## 2013-12-17 MED ORDER — ONDANSETRON HCL 4 MG/2ML IJ SOLN
4.0000 mg | Freq: Once | INTRAMUSCULAR | Status: AC
Start: 2013-12-17 — End: 2013-12-17
  Administered 2013-12-17: 4 mg via INTRAMUSCULAR

## 2013-12-17 NOTE — Progress Notes (Signed)
Pt states she has been off amitriptyline for the past 2 days.

## 2013-12-17 NOTE — Discharge Instructions (Signed)
Myelogram Discharge Instructions  1. Go home and rest quietly for the next 24 hours.  It is important to lie flat for the next 24 hours.  Get up only to go to the restroom.  You may lie in the bed or on a couch on your back, your stomach, your left side or your right side.  You may have one pillow under your head.  You may have pillows between your knees while you are on your side or under your knees while you are on your back.  2. DO NOT drive today.  Recline the seat as far back as it will go, while still wearing your seat belt, on the way home.  3. You may get up to go to the bathroom as needed.  You may sit up for 10 minutes to eat.  You may resume your normal diet and medications unless otherwise indicated.  Drink lots of extra fluids today and tomorrow.  4. The incidence of headache, nausea, or vomiting is about 5% (one in 20 patients).  If you develop a headache, lie flat and drink plenty of fluids until the headache goes away.  Caffeinated beverages may be helpful.  If you develop severe nausea and vomiting or a headache that does not go away with flat bed rest, call [phone].  5. You may resume normal activities after your 24 hours of bed rest is over; however, do not exert yourself strongly or do any heavy lifting tomorrow. If when you get up you have a headache when standing, go back to bed and force fluids for another 24 hours.  6. Call your physician for a follow-up appointment.  The results of your myelogram will be sent directly to your physician by the following day.  7. If you have any questions or if complications develop after you arrive home, please call [phone].  Discharge instructions have been explained to the patient.  The patient, or the person responsible for the patient, fully understands these instructions.      May resume Amitriptyline on December 18, 2013, after 1:00 pm.

## 2013-12-31 ENCOUNTER — Emergency Department (INDEPENDENT_AMBULATORY_CARE_PROVIDER_SITE_OTHER)
Admission: EM | Admit: 2013-12-31 | Discharge: 2013-12-31 | Disposition: A | Discharge status: Home / Self Care | Payer: Medicare Other | Source: Home / Self Care

## 2013-12-31 ENCOUNTER — Encounter (HOSPITAL_COMMUNITY): Payer: Self-pay | Admitting: Emergency Medicine

## 2013-12-31 DIAGNOSIS — Z765 Malingerer [conscious simulation]: Secondary | ICD-10-CM

## 2013-12-31 DIAGNOSIS — F1910 Other psychoactive substance abuse, uncomplicated: Secondary | ICD-10-CM

## 2013-12-31 DIAGNOSIS — Z760 Encounter for issue of repeat prescription: Secondary | ICD-10-CM

## 2013-12-31 DIAGNOSIS — Z09 Encounter for follow-up examination after completed treatment for conditions other than malignant neoplasm: Secondary | ICD-10-CM
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
## 2014-01-01 NOTE — ED Provider Notes (Signed)
Medical screening examination/treatment/procedure(s) were performed by non-physician practitioner and as supervising physician I was immediately available for consultation/collaboration.  Leslee Homeavid Jessie Cowher, M.D.  Reuben Likesavid C Alyaan Budzynski, MD 01/01/14 [phone]

## 2014-02-14 ENCOUNTER — Ambulatory Visit: Payer: Medicare Other | Admitting: Internal Medicine

## 2015-10-11 LAB — BASIC METABOLIC PANEL
BUN: 16 mg/dL (ref 4–21)
CREATININE: 0.9 mg/dL (ref 0.5–1.1)
Glucose: 85 mg/dL
POTASSIUM: 4.6 mmol/L (ref 3.4–5.3)
SODIUM: 138 mmol/L (ref 137–147)

## 2015-10-11 LAB — CBC AND DIFFERENTIAL: PLATELETS: 233 10*3/uL (ref 150–399)

## 2015-10-11 LAB — TSH: TSH: 0.01 u[IU]/mL — AB (ref 0.41–5.90)

## 2015-10-11 LAB — LIPID PANEL
CHOLESTEROL: 189 mg/dL (ref 0–200)
HDL: 4 mg/dL — AB (ref 35–70)

## 2015-10-12 ENCOUNTER — Encounter: Payer: Self-pay | Admitting: Gastroenterology

## 2015-10-25 ENCOUNTER — Encounter: Payer: Self-pay | Admitting: Internal Medicine

## 2015-11-30 ENCOUNTER — Encounter: Payer: Self-pay | Admitting: *Deleted

## 2015-12-01 ENCOUNTER — Ambulatory Visit: Payer: Medicare Other | Admitting: Endocrinology

## 2015-12-01 ENCOUNTER — Telehealth: Payer: Self-pay | Admitting: Endocrinology

## 2015-12-01 DIAGNOSIS — Z0289 Encounter for other administrative examinations: Secondary | ICD-10-CM

## 2015-12-01 NOTE — Telephone Encounter (Signed)
Patient no showed today's appt. Please advise on how to follow up. °A. No follow up necessary. °B. Follow up urgent. Contact patient immediately. °C. Follow up necessary. Contact patient and schedule visit in ___ days. °D. Follow up advised. Contact patient and schedule visit in ____weeks. ° °

## 2015-12-04 NOTE — Telephone Encounter (Signed)
Notify PCP 

## 2016-01-07 ENCOUNTER — Encounter: Payer: Self-pay | Admitting: Emergency Medicine

## 2016-01-07 ENCOUNTER — Other Ambulatory Visit: Payer: Self-pay

## 2016-01-07 ENCOUNTER — Emergency Department
Admission: EM | Admit: 2016-01-07 | Discharge: 2016-01-07 | Disposition: A | Discharge status: Home / Self Care | Payer: Medicare Other | Attending: Emergency Medicine | Admitting: Emergency Medicine

## 2016-01-07 DIAGNOSIS — I10 Essential (primary) hypertension: Secondary | ICD-10-CM | POA: Diagnosis not present

## 2016-01-07 DIAGNOSIS — M545 Low back pain, unspecified: Secondary | ICD-10-CM

## 2016-01-07 DIAGNOSIS — F32A Depression, unspecified: Secondary | ICD-10-CM

## 2016-01-07 DIAGNOSIS — F329 Major depressive disorder, single episode, unspecified: Secondary | ICD-10-CM | POA: Insufficient documentation

## 2016-01-07 LAB — ETHANOL: Alcohol, Ethyl (B): <5 mg/dL (ref <5)

## 2016-01-07 LAB — COMPREHENSIVE METABOLIC PANEL
ALT: 20 U/L (ref 14–54)
ANION GAP: 6 (ref 5–15)
AST: 22 U/L (ref 15–41)
Albumin: 4.3 g/dL (ref 3.5–5.0)
Alkaline Phosphatase: 76 U/L (ref 38–126)
BUN: 14 mg/dL (ref 6–20)
CALCIUM: 9.2 mg/dL (ref 8.9–10.3)
CHLORIDE: 106 mmol/L (ref 101–111)
CO2: 27 mmol/L (ref 22–32)
Creatinine, Ser: 0.79 mg/dL (ref 0.44–1.00)
Glucose, Bld: 88 mg/dL (ref 65–99)
Potassium: 4.3 mmol/L (ref 3.5–5.1)
SODIUM: 139 mmol/L (ref 135–145)
Total Bilirubin: 0.3 mg/dL (ref 0.3–1.2)
Total Protein: 7.6 g/dL (ref 6.5–8.1)

## 2016-01-07 LAB — CBC WITH DIFFERENTIAL/PLATELET
BASOS PCT: 1 %
Basophils Absolute: 0 10*3/uL (ref 0–0.1)
Eosinophils Absolute: 0.1 10*3/uL (ref 0–0.7)
Eosinophils Relative: 2 %
HEMATOCRIT: 36.4 % (ref 35.0–47.0)
HEMOGLOBIN: 12.4 g/dL (ref 12.0–16.0)
LYMPHS ABS: 2.9 10*3/uL (ref 1.0–3.6)
Lymphocytes Relative: 35 %
MCH: 30.8 pg (ref 26.0–34.0)
MCHC: 34.1 g/dL (ref 32.0–36.0)
MCV: 90.4 fL (ref 80.0–100.0)
MONOS PCT: 7 %
Monocytes Absolute: 0.6 10*3/uL (ref 0.2–0.9)
NEUTROS ABS: 4.6 10*3/uL (ref 1.4–6.5)
NEUTROS PCT: 55 %
Platelets: 225 10*3/uL (ref 150–440)
RBC: 4.03 MIL/uL (ref 3.80–5.20)
RDW: 13.1 % (ref 11.5–14.5)
WBC: 8.3 10*3/uL (ref 3.6–11.0)

## 2016-01-07 LAB — TSH: TSH: 1.749 u[IU]/mL (ref 0.350–4.500)

## 2016-01-07 LAB — URINE DRUG SCREEN, QUALITATIVE (ARMC ONLY)
AMPHETAMINES, UR SCREEN: NOT DETECTED
Barbiturates, Ur Screen: NOT DETECTED
Benzodiazepine, Ur Scrn: POSITIVE — AB
CANNABINOID 50 NG, UR ~~LOC~~: NOT DETECTED
COCAINE METABOLITE, UR ~~LOC~~: NOT DETECTED
MDMA (ECSTASY) UR SCREEN: NOT DETECTED
Methadone Scn, Ur: NOT DETECTED
Opiate, Ur Screen: NOT DETECTED
Phencyclidine (PCP) Ur S: NOT DETECTED
TRICYCLIC, UR SCREEN: POSITIVE — AB

## 2016-01-07 LAB — ACETAMINOPHEN LEVEL

## 2016-01-07 LAB — SALICYLATE LEVEL

## 2016-01-07 MED ORDER — LIDOCAINE 5 % EX PTCH: 1.0000 | MEDICATED_PATCH | CUTANEOUS | Status: AC

## 2016-01-07 NOTE — ED Notes (Signed)
Meal tray given to patient.
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
## 2016-01-07 NOTE — ED Notes (Signed)
Spoke with patient boyfriend, Loraine LericheMark, per patient request. Loraine LericheMark informed that patient will be evaluated by EDP and then treatment will be determined. Loraine LericheMark was informed of phone and visitation hours and given the number for the ED.

## 2016-01-07 NOTE — ED Notes (Addendum)
Patient arrives to [facility] ED from home via POV with complaint of depression and "no resolution to my problems". Denies HI/SI. +Hx Of same, not currently being treated by Md, however was recently seen in [place] for same
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
## 2016-01-07 NOTE — Discharge Instructions (Signed)

## 2016-01-07 NOTE — BH Assessment (Signed)
Writer consulted with the ER MD.  Patient denies SI/HI/Psychosis/Substance Abuse.  Patient requests outpatient resources. Writer provided patient with outpatient resources for intensive [facility], [facility] at [facility] as well as [place] open access clinic.    Patient requested a telephone to contact her boyfriend for a ride home.  Writer informed the ER RN that the patient will be discharging with resources per the ER MD.

## 2016-01-17 ENCOUNTER — Encounter (HOSPITAL_COMMUNITY): Payer: Self-pay

## 2016-01-17 ENCOUNTER — Emergency Department (HOSPITAL_COMMUNITY): Payer: Medicare Other

## 2016-01-17 ENCOUNTER — Emergency Department (HOSPITAL_COMMUNITY)
Admission: EM | Admit: 2016-01-17 | Discharge: 2016-01-18 | Disposition: A | Discharge status: Home / Self Care | Payer: Medicare Other | Attending: Emergency Medicine | Admitting: Emergency Medicine

## 2016-01-17 DIAGNOSIS — I10 Essential (primary) hypertension: Secondary | ICD-10-CM | POA: Insufficient documentation

## 2016-01-17 DIAGNOSIS — M545 Low back pain: Secondary | ICD-10-CM | POA: Insufficient documentation

## 2016-01-17 DIAGNOSIS — Z79899 Other long term (current) drug therapy: Secondary | ICD-10-CM | POA: Insufficient documentation

## 2016-01-17 DIAGNOSIS — Z791 Long term (current) use of non-steroidal anti-inflammatories (NSAID): Secondary | ICD-10-CM | POA: Insufficient documentation

## 2016-01-17 DIAGNOSIS — R45851 Suicidal ideations: Secondary | ICD-10-CM | POA: Diagnosis not present

## 2016-01-17 DIAGNOSIS — F32A Depression, unspecified: Secondary | ICD-10-CM

## 2016-01-17 DIAGNOSIS — M25562 Pain in left knee: Secondary | ICD-10-CM | POA: Diagnosis not present

## 2016-01-17 DIAGNOSIS — G8929 Other chronic pain: Secondary | ICD-10-CM | POA: Diagnosis not present

## 2016-01-17 DIAGNOSIS — F329 Major depressive disorder, single episode, unspecified: Secondary | ICD-10-CM | POA: Diagnosis present

## 2016-01-17 LAB — CBC
HEMATOCRIT: 38.2 % (ref 36.0–46.0)
Hemoglobin: 13.3 g/dL (ref 12.0–15.0)
MCH: 30.7 pg (ref 26.0–34.0)
MCHC: 34.8 g/dL (ref 30.0–36.0)
MCV: 88.2 fL (ref 78.0–100.0)
PLATELETS: 261 10*3/uL (ref 150–400)
RBC: 4.33 MIL/uL (ref 3.87–5.11)
RDW: 12.4 % (ref 11.5–15.5)
WBC: 10.3 10*3/uL (ref 4.0–10.5)

## 2016-01-17 LAB — COMPREHENSIVE METABOLIC PANEL
ALBUMIN: 4.4 g/dL (ref 3.5–5.0)
ALT: 22 U/L (ref 14–54)
AST: 22 U/L (ref 15–41)
Alkaline Phosphatase: 72 U/L (ref 38–126)
Anion gap: 8 (ref 5–15)
BILIRUBIN TOTAL: 0.6 mg/dL (ref 0.3–1.2)
BUN: 16 mg/dL (ref 6–20)
CALCIUM: 9.3 mg/dL (ref 8.9–10.3)
CO2: 26 mmol/L (ref 22–32)
CREATININE: 0.79 mg/dL (ref 0.44–1.00)
Chloride: 101 mmol/L (ref 101–111)
GFR calc Af Amer: >60 mL/min (ref >60)
GLUCOSE: 112 mg/dL — AB (ref 65–99)
Potassium: 3.8 mmol/L (ref 3.5–5.1)
Sodium: 135 mmol/L (ref 135–145)
TOTAL PROTEIN: 7.9 g/dL (ref 6.5–8.1)

## 2016-01-17 LAB — RAPID URINE DRUG SCREEN, HOSP PERFORMED
Amphetamines: NOT DETECTED
BARBITURATES: NOT DETECTED
BENZODIAZEPINES: POSITIVE — AB
Cocaine: NOT DETECTED
Opiates: NOT DETECTED
Tetrahydrocannabinol: NOT DETECTED

## 2016-01-17 LAB — ACETAMINOPHEN LEVEL: Acetaminophen (Tylenol), Serum: <10 ug/mL — ABNORMAL LOW (ref 10–30)

## 2016-01-17 LAB — SALICYLATE LEVEL: Salicylate Lvl: <4 mg/dL (ref 2.8–30.0)

## 2016-01-17 LAB — ETHANOL

## 2016-01-17 MED ORDER — LEVOTHYROXINE SODIUM 112 MCG PO TABS
112.0000 ug | ORAL_TABLET | Freq: Every day | ORAL | Status: DC
Start: 2016-01-18 — End: 2016-01-18
  Administered 2016-01-18: 112 ug via ORAL
  Filled 2016-01-17 (×2): qty 1

## 2016-01-17 MED ORDER — AMITRIPTYLINE HCL 100 MG PO TABS
300.0000 mg | ORAL_TABLET | Freq: Every day | ORAL | Status: DC
Start: 2016-01-17 — End: 2016-01-18
  Administered 2016-01-17: 300 mg via ORAL
  Filled 2016-01-17 (×2): qty 3

## 2016-01-17 MED ORDER — IBUPROFEN 200 MG PO TABS
400.0000 mg | ORAL_TABLET | Freq: Four times a day (QID) | ORAL | Status: DC | PRN
  Administered 2016-01-17: 400 mg via ORAL
  Filled 2016-01-17: qty 2

## 2016-01-17 MED ORDER — ALBUTEROL SULFATE HFA 108 (90 BASE) MCG/ACT IN AERS: 2.0000 | INHALATION_SPRAY | Freq: Four times a day (QID) | RESPIRATORY_TRACT | Status: DC | PRN

## 2016-01-17 MED ORDER — PANTOPRAZOLE SODIUM 40 MG PO TBEC
40.0000 mg | DELAYED_RELEASE_TABLET | Freq: Every day | ORAL | Status: DC
  Administered 2016-01-17: 40 mg via ORAL
  Filled 2016-01-17: qty 1

## 2016-01-17 NOTE — ED Notes (Signed)
PT and belongings wand by security
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
## 2016-01-17 NOTE — ED Notes (Addendum)
Patient c/o chronic bilateral lower back and left knee pain. Patient states she is severely depressed and has thoughts of suicide. Patient states she has someone she can call to help her commit suicide. Patient states she has a boyfriend that is only with her Thursday through Sundays. Patient then states she has two dogs that help her get her mind off of things. Patient also states she feels like someone just threw a bucket of crap at her and she has issues with medical problems. Patient  Denies HI, visual or auditory hallucinations. Patient denies drug or alcohol use.
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
## 2016-01-17 NOTE — Progress Notes (Signed)
Attempted to secure placement at the following facilities:   Faxed to: [facility]- [facility]- [facility]- [facility]- [facility]  No Beds: [place] [facility]- [facility]- [facility]- [facility] [place] [facility]- [facility]- [facility]- left message [facility]   Surie Suchocki, MSW, LCSW, LCAS [facility] Triage Specialist [phone] 

## 2016-01-18 DIAGNOSIS — F329 Major depressive disorder, single episode, unspecified: Secondary | ICD-10-CM | POA: Diagnosis not present

## 2016-01-18 NOTE — ED Notes (Signed)
[facility] to services of Dr. Betti Cruzeddy. Report can be called to [phone] and patient can be transported after 9:00 am.

## 2016-01-18 NOTE — ED Notes (Signed)
Patient left the unit ambulatory.  All belongings returned and signed for.  She was escorted out with [employer].  She requested to have her tote bag with her clothes removed from her car.  James from security was able to get the items for her.  [facility] was [facility].

## 2017-01-03 ENCOUNTER — Encounter (HOSPITAL_COMMUNITY): Payer: Self-pay | Admitting: *Deleted

## 2017-01-24 DEATH — deceased
